# Patient Record
Sex: Female | Born: 1960 | Race: White | Hispanic: No | Marital: Single | Smoking: Former smoker
Health system: Southern US, Community
[De-identification: ages and names within clinical notes are randomized; demographics above are authoritative.]

## PROBLEM LIST (undated history)

## (undated) DIAGNOSIS — K589 Irritable bowel syndrome without diarrhea: Secondary | ICD-10-CM

## (undated) DIAGNOSIS — Q282 Arteriovenous malformation of cerebral vessels: Secondary | ICD-10-CM

## (undated) DIAGNOSIS — N029 Recurrent and persistent hematuria with unspecified morphologic changes: Secondary | ICD-10-CM

## (undated) DIAGNOSIS — K219 Gastro-esophageal reflux disease without esophagitis: Secondary | ICD-10-CM

## (undated) HISTORY — DX: Arteriovenous malformation of cerebral vessels: Q28.2

## (undated) HISTORY — DX: Irritable bowel syndrome, unspecified: K58.9

## (undated) HISTORY — DX: Gastro-esophageal reflux disease without esophagitis: K21.9

## (undated) HISTORY — DX: Recurrent and persistent hematuria with unspecified morphologic changes: N02.9

---

## 2002-02-17 ENCOUNTER — Encounter: Payer: Self-pay | Admitting: Neurosurgery

## 2002-02-17 ENCOUNTER — Ambulatory Visit (HOSPITAL_COMMUNITY): Admission: RE | Admit: 2002-02-17 | Discharge: 2002-02-17 | Payer: Self-pay | Admitting: Neurosurgery

## 2002-03-03 ENCOUNTER — Ambulatory Visit (HOSPITAL_COMMUNITY): Admission: RE | Admit: 2002-03-03 | Discharge: 2002-03-03 | Payer: Self-pay | Admitting: Neurosurgery

## 2002-03-08 ENCOUNTER — Encounter: Payer: Self-pay | Admitting: Neurosurgery

## 2002-03-08 ENCOUNTER — Inpatient Hospital Stay (HOSPITAL_COMMUNITY): Admission: RE | Admit: 2002-03-08 | Discharge: 2002-03-15 | Payer: Self-pay | Admitting: Neurosurgery

## 2002-03-09 ENCOUNTER — Encounter: Payer: Self-pay | Admitting: Neurosurgery

## 2002-03-10 ENCOUNTER — Encounter (INDEPENDENT_AMBULATORY_CARE_PROVIDER_SITE_OTHER): Payer: Self-pay | Admitting: Specialist

## 2002-03-10 HISTORY — PX: BRAIN SURGERY: SHX531

## 2002-03-12 ENCOUNTER — Encounter: Payer: Self-pay | Admitting: Neurosurgery

## 2002-04-01 ENCOUNTER — Encounter: Admission: RE | Admit: 2002-04-01 | Discharge: 2002-04-23 | Payer: Self-pay | Admitting: Neurosurgery

## 2002-08-27 ENCOUNTER — Encounter: Admission: RE | Admit: 2002-08-27 | Discharge: 2002-08-27 | Payer: Self-pay | Admitting: Family Medicine

## 2002-08-27 ENCOUNTER — Encounter: Payer: Self-pay | Admitting: Family Medicine

## 2002-09-09 ENCOUNTER — Ambulatory Visit (HOSPITAL_COMMUNITY): Admission: RE | Admit: 2002-09-09 | Discharge: 2002-09-09 | Payer: Self-pay | Admitting: Neurosurgery

## 2002-09-09 ENCOUNTER — Encounter: Payer: Self-pay | Admitting: Neurosurgery

## 2002-11-26 ENCOUNTER — Encounter: Admission: RE | Admit: 2002-11-26 | Discharge: 2002-11-26 | Payer: Self-pay | Admitting: Neurosurgery

## 2002-11-26 ENCOUNTER — Encounter: Payer: Self-pay | Admitting: Neurosurgery

## 2003-04-01 ENCOUNTER — Ambulatory Visit (HOSPITAL_COMMUNITY): Admission: RE | Admit: 2003-04-01 | Discharge: 2003-04-01 | Payer: Self-pay | Admitting: Interventional Radiology

## 2003-09-07 ENCOUNTER — Encounter: Admission: RE | Admit: 2003-09-07 | Discharge: 2003-09-07 | Payer: Self-pay | Admitting: Neurosurgery

## 2003-10-05 ENCOUNTER — Other Ambulatory Visit: Admission: RE | Admit: 2003-10-05 | Discharge: 2003-10-05 | Payer: Self-pay | Admitting: Family Medicine

## 2003-10-10 ENCOUNTER — Encounter: Admission: RE | Admit: 2003-10-10 | Discharge: 2003-10-10 | Payer: Self-pay | Admitting: Family Medicine

## 2004-04-21 IMAGING — XA IR ANGIO/CAROTID/CERV BI
1 series · 15 of 24 positions shown · IV contrast (omnipaque)
Comparison: none

FINDINGS
CLINICAL DATA: LEFT-SIDED NUMBNESS - INTERMITTENT, MRI REVEALING RIGHT CEREBRAL HEMISPHERE
ARTERIOVENOUS MALFORMATION.
CAROTID AND CEREBRAL ARTERIOGRAMS
FOLLOWING THE FULL EXPLANATION OF THE PROCEDURE ALONG WITH THE POTENTIALLY ASSOCIATED
COMPLICATIONS, AN INFORMED WITNESS CONSENT WAS OBTAINED.
THE RIGHT GROIN WAS PREPPED AND DRAPED IN THE USUAL STERILE FASHION.  THEREFORE, USING MODIFIED
SELDINGER TECHNIQUE, TRANSFEMORAL ACCESS INTO THE RIGHT COMMON FEMORAL ARTERY WAS OBTAINED WITHOUT
DIFFICULTY.  OVER A 0.035 INCH GUIDE WIRE, A 5-FRENCH PINNACLE SHEATH WAS INSERTED.  THROUGH THIS
AND ALSO OVER A 0.035 INCH GUIDE WIRE, A 5-FRENCH JB-1 CATHETER WAS ADVANCED TO THE AORTIC ARCH
REGION AND SELECTIVE CANNULATION ARTERIOGRAM WAS PERFORMED OF THE RIGHT COMMON CAROTID ARTERY, THE
RIGHT INTERNAL CAROTID ARTERY, THE RIGHT VERTEBRAL ARTERY, THE LEFT COMMON CAROTID ARTERY ARTERY,
AND THE LEFT VERTEBRAL ARTERY IN THAT ORDER.  THERE WERE NO ACUTE COMPLICATIONS.  THE PATIENT
TOLERATED THE PROCEDURE WELL.
MEDICATIONS UTILIZED:  VERSED 1 MG IV, FENTANYL 25 MG IV.
CONTRAST UTILIZED:  OMNIPAQUE 300, APPROXIMATELY 80 CC.

[Series 1: run · 15 of 154 slices shown]
[im 1/154]
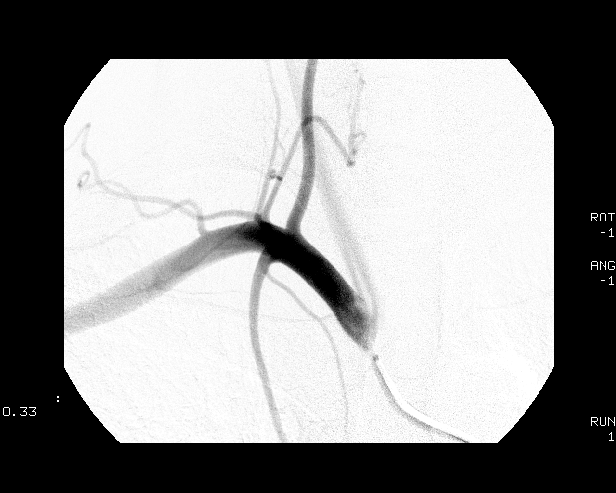
[im 14/154]
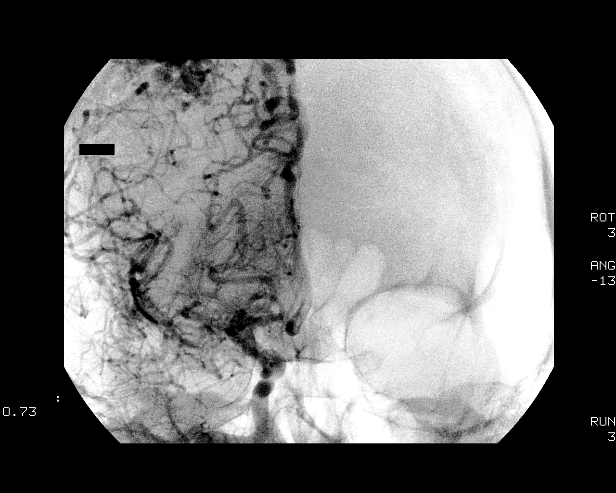
[im 27/154]
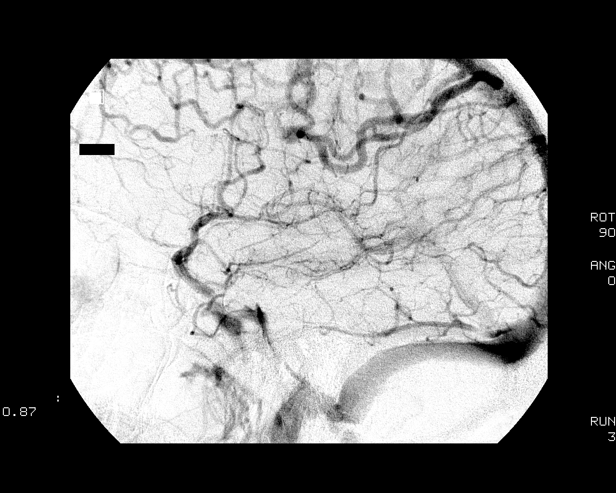
[im 34/154]
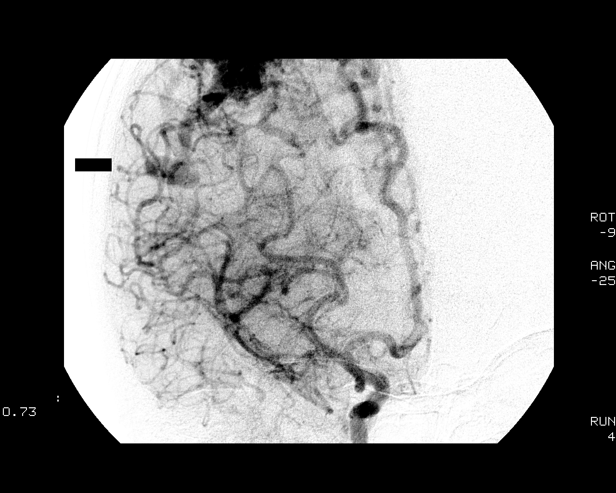
[im 47/154]
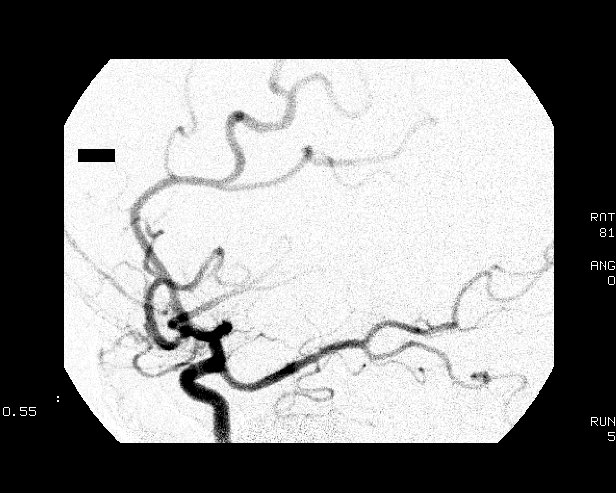
[im 54/154]
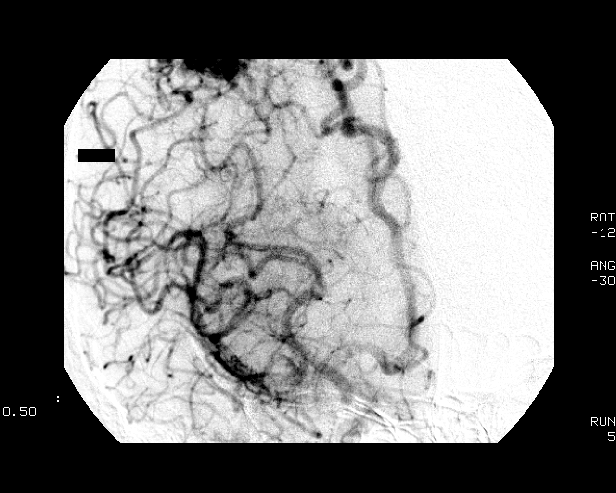
[im 67/154]
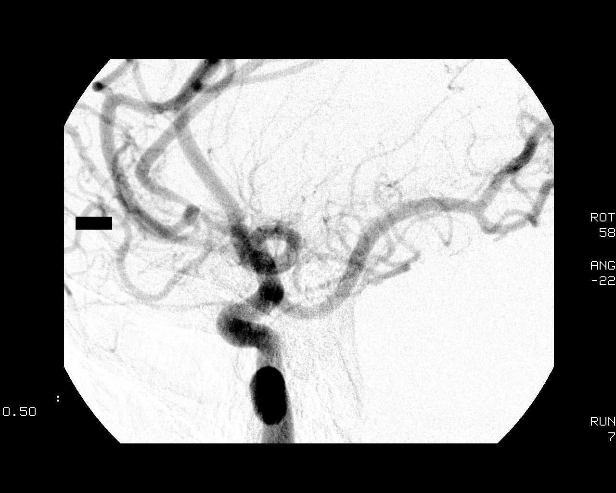
[im 80/154]
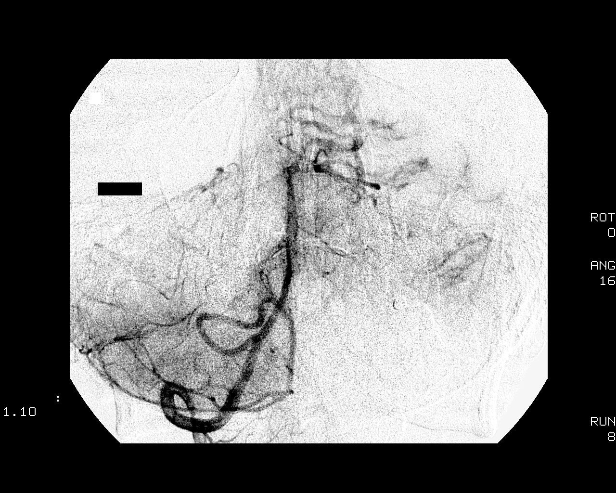
[im 87/154]
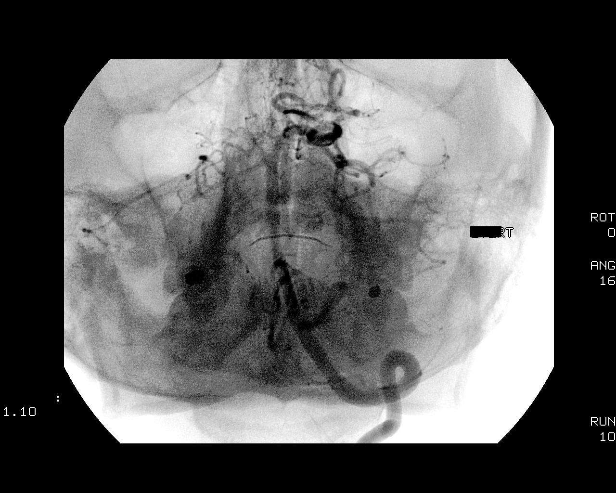
[im 100/154]
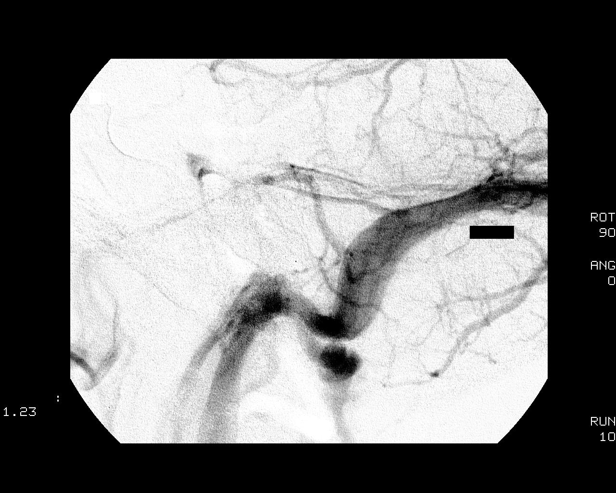
[im 107/154]
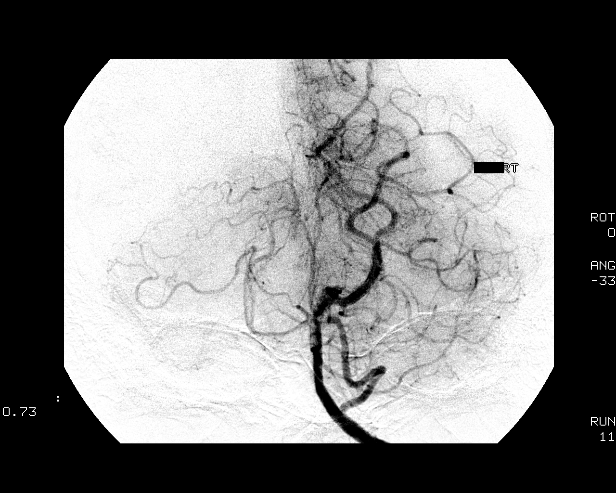
[im 120/154]
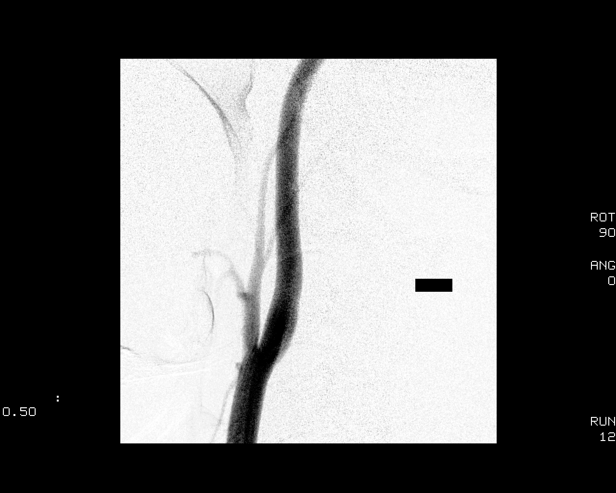
[im 134/154]
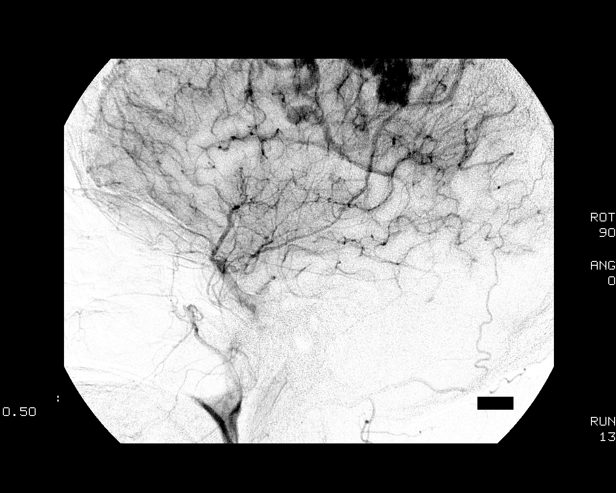
[im 140/154]
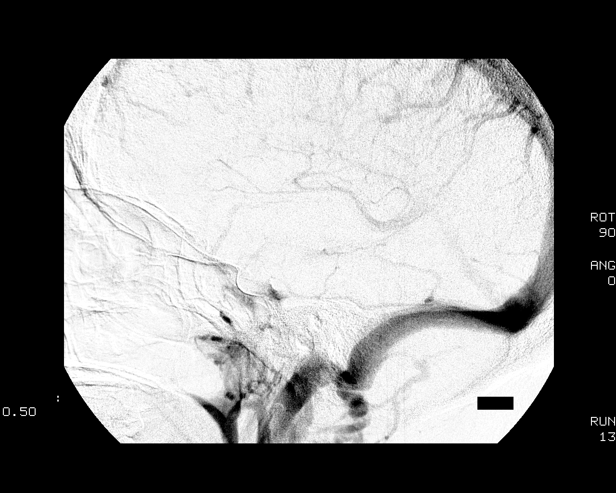
[im 154/154]
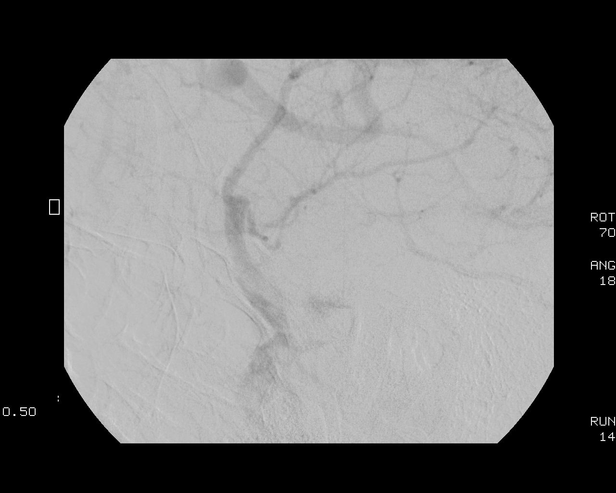

[15 of 24 positions shown; findings below may reference images not displayed]

FINDINGS: THE RIGHT COMMON CAROTID ARTERIOGRAM DEMONSTRATES THE RIGHT EXTERNAL CAROTID ARTERY
ORIGIN AND BRANCHES TO BE NORMAL.   THE RIGHT INTERNAL CAROTID ARTERY AT THE BULB AND DISTALLY IS
NORMAL.  THE PETROUS, CAVERNOUS, AND SUPRACLINOID ICA ALSO DEMONSTRATE NORMAL MORPHOLOGY AND
OPACIFICATION.   A DOMINANT RIGHT P-COM OPACIFIES THE RIGHT PCA DISTRIBUTION.
THE RIGHT ANTERIOR CEREBRAL ARTERY IS ABNORMALLY PROMINENT LEADING TO ITS PERICALLOSAL AND CALLOSAL
MARGINAL BRANCHES WHICH ARE ALSO ABNORMALLY PROMINENT.  THESE LEAD INTO A 2 CM NIDUS OVERLYING THE
RIGHT POSTERIOR FRONTAL CONVEXITY WITH SUBSEQUENT SINGLE VENOUS CONDUIT WHICH THEN SPLITS INTO TWO
VENOUS CORTICAL BRANCHES DRAINING INTO THE SUPERIOR SAGITTAL SINUS.    NO EVIDENCE OF INTERNIDAL OR
PRENIDAL ANEURYSM IS DEMONSTRATED ANGIOGRAPHICALLY.
ALSO NOTED IS SEVERE FOCAL STENOSIS INVOLVING THE RIGHT MIDDLE CEREBRAL ARTERY IN ITS M1 SEGMENT
ASSOCIATED WITH POSSIBLE EARLY PROMINENT LENTICULOSTRIATE ARTERIES.
THERE IS DELAYED OPACIFICATION PROXIMALLY IN THE ANGULAR BRANCH OF THE RIGHT MIDDLE CEREBRAL ARTERY
WITH SUBSEQUENT RETROGRADE OPACIFICATION FROM THE DISTAL PERICALLOSAL BRANCHES, AND THE RIGHT P3
BRANCHES.    A SELECTIVE RIGHT INTERNAL CAROTID ARTERY INJECTION DEMONSTRATES THE ABOVE FINDINGS
MORE CLEARLY INVOLVING THE RIGHT MIDDLE CEREBRAL ARTERY, AND ALSO THE ARTERIAL FEEDERS TO THE RIGHT
FRONTAL LOBE AVM AS DESCRIBED.
THE RIGHT VERTEBRAL ARTERY IS A NONDOMINANT VERTEBRAL ARTERY WITH NORMAL ORIGIN.  THE VESSEL
ASCENDS NORMALLY THROUGH THE CRANIAL SKULL BASE.  THERE IS NORMAL OPACIFICATION OF THE RIGHT
POSTERIOR INFERIOR CEREBELLAR ARTERY.  TRANSIENT OPACIFICATION OF THE BASILAR ARTERY AND THE
SUPERIOR CEREBELLAR ARTERY IS NOTED VIA THIS INJECTION.
THE LEFT COMMON CAROTID ARTERIOGRAM DEMONSTRATES THE LEFT EXTERNAL CAROTID ARTERY ORIGIN AND
BRANCHES TO BE NORMAL.  THE LEFT INTERNAL CAROTID ARTERY AT THE BULB AND DISTALLY IS NORMAL.  THE
PETROUS, CAVERNOUS, AND THE SUPRACLINOID SEGMENTS ARE NORMAL.   THE LEFT ANTERIOR AND THE LEFT
MIDDLE CEREBRAL ARTERIES OPACIFY NORMALLY INTO CAPILLARY AND VENOUS PHASES.
THERE WAS CROSS OPACIFICATION VIA THE A-COM OF THE RIGHT ACA DISTRIBUTION DISTAL TO THE A2 WITH
SUBSEQUENT OPACIFICATION OF THE RIGHT FRONTAL AVM AS DESCRIBED PREVIOUSLY.   IN THE A-COM REGION
THERE IS AN APPROXIMATELY 1 MM SACCULAR OUTPOUCHING PROJECTING ANTERIORLY MOST CONSISTENT WITH A
SMALL ANEURYSM IN THIS REGION.
THE LEFT VERTEBRAL ARTERY ORIGIN IS NORMAL.  THIS VESSEL IS THE DOMINANT VERTEBRAL ARTERY WHICH
ASCENDS NORMALLY THROUGH THE CRANIAL SKULL BASE.  THE RIGHT POSTERIOR INFERIOR CEREBRAL ARTERY, THE
BASILAR ARTERY, THE POSTERIOR CEREBRAL ARTERY, THE SUPERIOR CEREBELLAR ARTERIES AND THE ANTERIOR
INFERIOR CEREBELLAR ARTERIES OPACIFY NORMALLY INTO CAPILLARY AND VENOUS PHASES.
IMPRESSION
1.  RIGHT POSTEROFRONTAL ARTERIOVENOUS MALFORMATION PRIMARILY CORTICAL BASED SUPPLIED BY TWO
ARTERIAL FEEDERS FROM THE PERICALLOSAL AND AT LEAST TWO ARTERIAL FEEDERS FROM THE CALLOSAL MARGINAL
ARTERY.  THE AVM HAS A NIDUS OF APPROXIMATELY 2 CM.
2.  TWO VENOUS DRAINING VEINS INTO THE SUPERIOR SAGITTAL SINUS.
3.  SIGNIFICANT RIGHT MIDDLE CEREBRAL ARTERY STENOSIS.  THIS MAY REPRESENT A SEQUELAE OF DISSECTION
VERSUS LESS LIKELY POSSIBILITY OF FOCAL ATHEROSCLEROTIC DISEASE.
4.  APPROXIMATELY 1 MM SACCULAR OUTPOUCHING IN THE A-COM REGION PROBABLY REPRESENTING A SMALL
ANEURYSM, POSSIBLY RELATED TO HEMODYNAMIC STRESS DUE TO THE AVM.

## 2004-09-05 ENCOUNTER — Encounter: Admission: RE | Admit: 2004-09-05 | Discharge: 2004-09-05 | Payer: Self-pay | Admitting: Neurosurgery

## 2004-10-12 ENCOUNTER — Ambulatory Visit (HOSPITAL_COMMUNITY): Admission: RE | Admit: 2004-10-12 | Discharge: 2004-10-12 | Payer: Self-pay | Admitting: Neurosurgery

## 2005-02-21 ENCOUNTER — Other Ambulatory Visit: Admission: RE | Admit: 2005-02-21 | Discharge: 2005-02-21 | Payer: Self-pay | Admitting: Family Medicine

## 2005-06-10 HISTORY — PX: SPINE SURGERY: SHX786

## 2005-06-27 ENCOUNTER — Encounter: Admission: RE | Admit: 2005-06-27 | Discharge: 2005-06-27 | Payer: Self-pay | Admitting: Family Medicine

## 2005-12-17 ENCOUNTER — Ambulatory Visit (HOSPITAL_BASED_OUTPATIENT_CLINIC_OR_DEPARTMENT_OTHER): Admission: RE | Admit: 2005-12-17 | Discharge: 2005-12-17 | Payer: Self-pay | Admitting: Urology

## 2006-01-17 ENCOUNTER — Ambulatory Visit: Payer: Self-pay | Admitting: Family Medicine

## 2006-02-14 ENCOUNTER — Encounter: Admission: RE | Admit: 2006-02-14 | Discharge: 2006-02-14 | Payer: Self-pay | Admitting: Neurosurgery

## 2006-03-11 ENCOUNTER — Ambulatory Visit: Payer: Self-pay | Admitting: Family Medicine

## 2006-03-14 ENCOUNTER — Encounter: Admission: RE | Admit: 2006-03-14 | Discharge: 2006-03-14 | Payer: Self-pay | Admitting: Neurosurgery

## 2006-04-02 ENCOUNTER — Ambulatory Visit (HOSPITAL_COMMUNITY): Admission: RE | Admit: 2006-04-02 | Discharge: 2006-04-03 | Payer: Self-pay | Admitting: Neurosurgery

## 2006-04-14 ENCOUNTER — Ambulatory Visit: Payer: Self-pay | Admitting: Family Medicine

## 2006-05-15 ENCOUNTER — Encounter: Admission: RE | Admit: 2006-05-15 | Discharge: 2006-05-15 | Payer: Self-pay | Admitting: Neurosurgery

## 2006-07-04 ENCOUNTER — Encounter: Admission: RE | Admit: 2006-07-04 | Discharge: 2006-07-04 | Payer: Self-pay | Admitting: Neurosurgery

## 2006-07-04 ENCOUNTER — Ambulatory Visit: Payer: Self-pay | Admitting: Family Medicine

## 2006-09-26 ENCOUNTER — Ambulatory Visit: Payer: Self-pay | Admitting: Family Medicine

## 2006-10-06 ENCOUNTER — Ambulatory Visit: Payer: Self-pay | Admitting: Family Medicine

## 2006-12-15 ENCOUNTER — Ambulatory Visit: Payer: Self-pay | Admitting: Family Medicine

## 2007-02-26 ENCOUNTER — Ambulatory Visit: Payer: Self-pay | Admitting: Family Medicine

## 2007-02-26 ENCOUNTER — Other Ambulatory Visit: Admission: RE | Admit: 2007-02-26 | Discharge: 2007-02-26 | Payer: Self-pay | Admitting: Family Medicine

## 2007-03-05 ENCOUNTER — Ambulatory Visit: Payer: Self-pay | Admitting: Family Medicine

## 2007-03-19 LAB — HM DEXA SCAN: HM Dexa Scan: NORMAL

## 2007-03-26 ENCOUNTER — Ambulatory Visit: Payer: Self-pay | Admitting: Family Medicine

## 2007-04-17 ENCOUNTER — Ambulatory Visit: Payer: Self-pay | Admitting: Cardiology

## 2007-04-20 LAB — HM COLONOSCOPY

## 2007-05-01 ENCOUNTER — Encounter: Payer: Self-pay | Admitting: Cardiology

## 2007-05-01 ENCOUNTER — Ambulatory Visit: Payer: Self-pay

## 2007-05-01 ENCOUNTER — Ambulatory Visit: Payer: Self-pay | Admitting: Cardiology

## 2007-05-22 ENCOUNTER — Ambulatory Visit: Payer: Self-pay | Admitting: Family Medicine

## 2007-08-14 ENCOUNTER — Ambulatory Visit: Payer: Self-pay | Admitting: Family Medicine

## 2007-09-22 ENCOUNTER — Ambulatory Visit: Payer: Self-pay | Admitting: Cardiology

## 2007-11-05 ENCOUNTER — Ambulatory Visit: Payer: Self-pay | Admitting: Family Medicine

## 2008-01-28 ENCOUNTER — Ambulatory Visit: Payer: Self-pay | Admitting: Family Medicine

## 2008-04-22 ENCOUNTER — Ambulatory Visit: Payer: Self-pay | Admitting: Family Medicine

## 2008-07-18 ENCOUNTER — Ambulatory Visit: Payer: Self-pay | Admitting: Family Medicine

## 2008-10-24 ENCOUNTER — Ambulatory Visit: Payer: Self-pay | Admitting: Family Medicine

## 2009-08-23 ENCOUNTER — Ambulatory Visit: Payer: Self-pay | Admitting: Physician Assistant

## 2009-08-23 ENCOUNTER — Other Ambulatory Visit: Admission: RE | Admit: 2009-08-23 | Discharge: 2009-08-23 | Payer: Self-pay | Admitting: Family Medicine

## 2010-10-02 ENCOUNTER — Ambulatory Visit (INDEPENDENT_AMBULATORY_CARE_PROVIDER_SITE_OTHER): Payer: Managed Care, Other (non HMO) | Admitting: Family Medicine

## 2010-10-02 DIAGNOSIS — R51 Headache: Secondary | ICD-10-CM

## 2010-10-23 NOTE — Assessment & Plan Note (Signed)
[facility]                            CARDIOLOGY OFFICE NOTE   LADAWN, BOULLION                      MRN:          [id number]  DATE:09/22/2007                            DOB:          [birth year]    REASON FOR VISIT:  Follow-up testing.   HISTORY OF PRESENT ILLNESS:  I saw Ms. Yablonski back in November.  She  was referred at that time with occasional palpitations and intermittent  left arm and chest discomfort.  I referred her for an exercise  echocardiogram which showed no electrocardiographic or echocardiographic  evidence of ischemia and a good workload.  Her baseline images  demonstrated no significant valvular heart disease and a normal ejection  fraction of 65%.  She wore a cardiac monitor and we received a hookup  tracing, but she had no other major symptoms while wearing the device.  Her electrocardiogram in follow-up today shows sinus rhythm at 60 beats  per minute.  Symptomatically, she has not had any new problems and  actually reports feeling fairly well.  We talked about her studies and  suggested that she continue with a strategy of observation at this  point.  Clearly, if her symptoms progress or worsen, we can consider  additional evaluation.   ALLERGIES:  LATEX.   MEDICATIONS:  1. Depo-Provera.  2. Advil p.r.n.  3. Aspirin 81 mg daily.   REVIEW OF SYSTEMS:  As described in the history of present illness,  otherwise, negative.   PHYSICAL EXAMINATION:  VITAL SIGNS:  Blood pressure is 122/78, heart  rate is 60, weight 152 pounds.  GENERAL:  The patient is comfortable and in no acute distress.  NECK:  Shows no elevated jugular venous pressure.  No loud bruits.  LUNGS:  Clear, unlabored breathing.  CARDIAC:  Regular rate and rhythm.  No murmur, rub or gallop.  EXTREMITIES:  No pitting edema.   IMPRESSION/RECOMMENDATION:  Reassuring cardiac evaluation.  The  patient's exercise echocardiogram was normal and we defined no  specific  dysrhythmias with event monitoring.  The patient is symptomatically  stable at this point, and I have recommended observation.  If she  has any progressive symptomatology, we could certainly evaluate her  further.  She will continue to follow up with Dr. Susann Givens.     Jonelle Sidle, MD  Electronically Signed    SGM/MedQ  DD: 09/22/2007  DT: 09/22/2007  Job #: [id number]   cc:   Sharlot Gowda, M.D.

## 2010-10-23 NOTE — Assessment & Plan Note (Signed)
[facility]                            CARDIOLOGY OFFICE NOTE   TA, FAIR                      MRN:          [id number]  DATE:04/17/2007                            DOB:          Mar 10, 1961    REFERRING PHYSICIAN:  Sharlot Gowda, M.D.   REASON FOR CONSULTATION:  History of palpitations and left arm  discomfort.   HISTORY OF PRESENT ILLNESS:  The patient is a 50 year old woman with a  history of irritable bowel syndrome as well as previous brain  arteriovenous malformation status post embolization in October of 2003.  She also had a cervical fusion with findings of cervical disk disease  that may have actually been related to some of her left arm discomfort  as she states that she has had fewer of these symptoms since that time.  She also reports a fairly chronic numbness and mild degree of weakness  in her left arm following her prior brain surgery.  She does state that  she has had some recurrence of posterior left upper arm discomfort,  although, this is very sporadic and not clearly exertional in nature.  She does experience some dyspnea on exertion, but does not describe this  as being any greater than NYHA class II.  She has no exertional chest  pain.  She has noticed over the last few years as well a sensation of  palpitations described as a flutter in her chest as well as  occasionally a skip.  The fluttering sensation is typically sudden in  onset and lasts up to a few minutes at a time.  She may get these once  or twice a month and never in association with any syncope, left arm or  chest discomfort.  Her resting electrocardiogram shows normal sinus  rhythm with normal intervals and heart rate is 74 beats per minute.  She  reports having an echocardiogram done a little greater than a year ago  which may have shown some evidence of mitral valve prolapse based on her  description, although, I do not have the formal report at hand.   We have  been asked to assist with further risk stratification and assessment.   ALLERGIES:  LATEX.   CURRENT MEDICATIONS:  1. Depo-Provera.  2. Protonix twice weekly.  3. Aspirin 81 mg a day.  4. Advil.  5. Scolaxin p.r.n.  6. Amitiza p.r.n.  7. Tylenol Sinus p.r.n.   PAST MEDICAL HISTORY:  As outlined above.   SOCIAL HISTORY:  The patient is single.  She has no children.  She is a  Visual merchandiser.  She has a prior longstanding tobacco use history for  23 years, but quit in November of 2005.  She drinks 4-5 beers on a daily  basis, 4 cups of caffeinated beverage a day.  She enjoys golfing and  walks 1-2 miles a day at her job.   FAMILY HISTORY:  Significant for hypertension in the patient's father  who is still living.  Her maternal grandmother had heart disease.   REVIEW OF SYSTEMS:  Described in the history of present illness.  Seasonal allergies are noted.  Some constipation, reflux symptoms, and  menstrual dysfunction.   PHYSICAL EXAMINATION:  VITAL SIGNS:  Blood pressure 136/77, heart rate  82, weight 148 pounds.  GENERAL:  The patient is comfortable and in no acute distress.  HEENT:  Conjunctivae and lids normal.  Oropharynx is clear.  NECK:  Supple.  No elevated jugular venous pressure, no loud bruits.  No  thyromegaly is noted.  LUNGS:  Clear without labored breathing at rest.  HEART:  Regular rate and rhythm.  No obvious mid systolic click.  Soft,  systolic murmur is noted.  No pericardial rub or S3 gallop.  ABDOMEN:  Nontender.  Normal active bowel sounds.  Soft.  EXTREMITIES:  No pitting edema.  Skin is warm and dry.  Distal pulses  are 2+.  MUSCULOSKELETAL:  No kyphosis is noted.  NEUROPSYCHIATRIC:  The patient is alert and oriented x3.  Affect is  normal.   IMPRESSION:  1. History of intermittent left arm discomfort.  This really seems to      be more chronic and recurring and perhaps related to her cervical      disk disease and prior brain  arteriovenous malformation surgery.      The symptoms are nonexertional and do not appear to be associated      with any shortness of breath or other functional limitation.  It      seems unlikely that this is related to underlying ischemic heart      disease.  She does have intermittent palpitations which could be      episodic paroxysmal supraventricular tachycardia.  Her only      exertional symptom at this time seems to be dyspnea when she is      playing golf, although, she does not describe this as being greater      than NYHA class II.  Her resting electrocardiogram is normal.  She      has undergone no prior cardiac risk stratification and our plan at      this point will be an exercise echocardiogram to exclude any      exercise-induced dysrhythmia and also rule out ischemic which I      think is unlikely at this point.  In addition to this, I am      providing a 30-day event recorder to see if we can capture one of      her episodes of rapid palpitations.  I will have her follow up with      me in the office over the next month and we can review the results      of her studies.  2. Further plans to follow.     Jonelle Sidle, MD  Electronically Signed    SGM/MedQ  DD: 04/17/2007  DT: 04/18/2007  Job #: [id number]   cc:   Elpidio Galea, P.A.C.

## 2010-10-26 NOTE — Op Note (Signed)
NAMERONIQUE, SIMERLY               ACCOUNT NO.:  [id number]   MEDICAL RECORD NO.:  [id number]          PATIENT TYPE:  AMB   LOCATION:  [facility]                         FACILITY:  [facility]   PHYSICIAN:  Excell Seltzer. Annabell Howells, M.D.    DATE OF BIRTH:  [birth year]   DATE OF PROCEDURE:  12/17/2005  DATE OF DISCHARGE:                                 OPERATIVE REPORT   PROCEDURE:  Cystoscopy, bilateral retrograde pyelogram with interpretation  and right ureteroscopy.   PREOPERATIVE DIAGNOSIS:  Right distal ureteral stone.   POSTOPERATIVE DIAGNOSIS:  History of right ureteral stone with stone not  found at procedure.   SURGEON:  Dr. Bjorn Pippin.   ANESTHESIA:  General.   COMPLICATIONS:  None.   INDICATIONS:  Ms. Toniann Ket is a 50 year old white female with history of right  distal ureteral stone on CT scan.  She has had intermittent pain since  December 6.  She has had a follow-up KUB's that have revealed persistent  presence of calcification in the right pelvis that was felt to be her stone.  Urine has had persistent microhematuria.  It was felt that cystoscopy and  possible ureteroscopy and retrograde pyelograms were indicated for further  evaluation.  She was having some left sided pain and wanted me to check the  left side as well.  I agreed to do that with the retrograde.   FINDINGS AND PROCEDURE:  The patient is given Cipro.  She was taken the  operating room where latex precautions were in place.  A general anesthetic  was induced.  She was placed in lithotomy position.  Her perineum and  genitalia were prepped with Betadine solution.  She was draped in the usual  sterile fashion.  Cystoscopy was performed using a 22-French scope and 12  and 70 degrees lenses.  Examination revealed normal urethra.  The bladder  wall had mild trabeculation without tumor, stones or inflammation.  Ureteral  orifices were unremarkable.   The left ureteral orifice was cannulated with a 5-French open end catheter  and contrast was instilled.   Left retrograde pyelogram demonstrated a normal ureter and intrarenal  collecting system without filling defects or other abnormalities.   The right ureteral orifice was then cannulated with a 5-French catheter and  contrast was instilled.   The right retrograde pyelogram demonstrated no obvious filling defects,  hydronephrosis or other abnormalities.  Initially I did not see the  calcification that had been present on the preop KUB; however, later in the  procedure it was identified medial to the ureter as noted on the retrograde  pyelogram, it was somewhat obscured by some of the table hardware.   Because the location of the calcification in relation to the ureter was not  readily apparent, I felt the right ureteroscopy was indicated.  The right  ureteral orifice was cannulated with a 6-French short ureteroscope.  This  was advanced approximately 4 cm up the ureter to the level of the  calcifications seen on KUB.  At this point I could identify the  calcification on the fluoroscopy and it was medial to the  tip of the  ureteroscope no stone was seen within the ureter.   At this point, additional contrast was instilled and then the calcification  was confirmed to be medial to the ureter suggesting that it was phlebolith  and not actually a ureteral stone.   At this point the bladder was drained.  The patient was taken down from  lithotomy position.  Her anesthetic was reversed.  She was removed to  recovery room in stable condition.  There were no complications.      Excell Seltzer. Annabell Howells, M.D.  Electronically Signed     JJW/MEDQ  D:  12/17/2005  T:  12/17/2005  Job:  [id number]

## 2010-10-26 NOTE — Op Note (Signed)
NAMESARAH, [name]                           ACCOUNT NO.:  [id number]   MEDICAL RECORD NO.:  [id number]                   PATIENT TYPE:  OIB   LOCATION:  3108                                 FACILITY:  [facility]   PHYSICIAN:  [name], M.D.               DATE OF BIRTH:  [birth year]   DATE OF PROCEDURE:  03/10/2002  DATE OF DISCHARGE:                                 OPERATIVE REPORT   PREOPERATIVE DIAGNOSIS:  Right posterior frontal arteriovenous malformation.   POSTOPERATIVE DIAGNOSIS:  Right posterior frontal arteriovenous  malformation.   PROCEDURE:  Right parietal craniotomy for arteriovenous malformation  resection with microdissection and frameless stereotactic guidance.   COMPLICATIONS:  None.   SURGEON:  [name], M.D.   ASSISTANT:  [name]. [name], M.D.   FINDINGS:  Arteriovenous malformation in the right posterior frontal cortex  beneath very large, superficial draining veins.   INDICATIONS:  The patient is a 50 year old who was incidentally found to  have an arteriovenous malformation.  She felt that she wanted to have this  out and did not want to deal with stereotactic radiosurgery.  She was  therefore admitted for preoperative embolization and then taken today for  craniotomy.   OPERATIVE NOTE:  The patient was brought to the operating room intubated and  placed under general anesthesia without difficulty.  After adequate  anesthesia was obtained, she was then positioned with a three pin [place]  and secured to the bed.  Stereotactic checkpoints were then used and the  stealth was set up.  The patient was then draped and she was prepped in a  sterile fashion.  I infiltrated 10 cc of 0.5% lidocaine, 1:200,000 strength  epinephrine into the horseshoe flap scalp for my incision.  I made the  incision with a #10 blade and took this down to the bone.  I reflected the  flap inferiorly and it was based inferiorly.  Raney clips were placed for  hemostasis.  I then made four bur holes at each corner of the scalp opening.  A craniotome was then used and I performed a craniotomy.  I tacked up the  dura around the edges except for the midline edge where I was going to base  a dural opening.  I then opened the dura inferiorly and cam around in a U-  shaped fashion basing it on this superior sagittal sinus.  Some arachnoid  granulations were found in the anteromedial corner of the opening.  I also  took down some minor adhesions overlying the arteriovenous malformation with  bipolar cautery.  I then brought the microscope in for microdissection.   I then started what was a very meticulously and painstaking dissection  around the arteriovenous malformation.  The drainage was superficial and on  the cortex and the bulk of the arteriovenous malformation was inferior to it  which necessitated a circumferential dissection around the borders  so that I  could then get underneath it and around it.  This was done over the course  of hours without any complication.  Dr. [name] assisted with the  microdissection and AVM resection.  After I made my way entirely around the  arteriovenous malformation, what was the major draining vessel was then  clamped with bipolar clips.  There was swelling of the arteriovenous  malformation.  The base was then coagulated and I cut through that with  microscissors.  Once that was done, the arteriovenous malformation was  delivered from the wound without difficulty.  Dr. [name] and I inspected the  wound bed.  There was one vessel which was slightly abnormal in appearance  but I could not see how it was connected in any way to the AVM.  There had  been essentially a bipolar-free dissection on the AVM and I certainly did  not think that I had been dissecting through the AVM as it was quite  globular in character.  There was little intervening brain between the  arteriovenous malformation.  I therefore did not feel it  was worthwhile to  dissect further and this vessel was left.  I planned on getting a  postoperative angiogram anyway.   Throughout the course of the dissection, I used Gelfoam and paddings to  protect the brain edges.  This was all done with the microscope and the  final resection cavity was hemostatic.  I wanted to use Surgicel.  I then  irrigated once more.  I then reapproximated dura.  I placed a central tack-  up and a tack-up along the sinus.  Jelly was lateral to the sinuses.  There  was one tack-up in the midline.  I then reapproximated the scalp flap.  I  then closed the scalp flap with staples.  Sterile dressing was applied.  The  patient was extubated, moving all extremities.                                               [name], M.D.    Luna Kitchens  D:  03/10/2002  T:  03/11/2002  Job:  [id number]

## 2010-10-26 NOTE — H&P (Signed)
NAMEJACALYN, Shannon [name]                           ACCOUNT NO.:  [id number]   MEDICAL RECORD NO.:  [id number]                   PATIENT TYPE:  OIB   LOCATION:  3172                                 FACILITY:  [facility]   PHYSICIAN:  [name], M.D.               DATE OF BIRTH:  August 06, 1960   DATE OF ADMISSION:  03/08/2002  DATE OF DISCHARGE:                                HISTORY & PHYSICAL   ADMISSION DIAGNOSIS:  Right frontal arteriovenous malformation.   INDICATIONS::  The patient presented to my office on 02/04/02 for evaluation  of  a right frontal arteriovenous malformation.  She had a history of  numbness and of pins and needles dysesthesia in the upper extremities  bilaterally in the hands and forearms.  This started after a visit to the  dentist where she received a local anesthetic.  She has also noticed that  her right leg and anterior thigh have had some tingling with a numb spot  below the knee. This led to a brain MRI which revealed an arteriovenous  malformation.  She also had an EMG which did not show evidence of carpal  tunnel disease.  She stated that she had had symptoms like that in the past.  They are bilateral.  She thinks the left-sided dysesthesias may be slightly  more prominent than on the right.   PAST MEDICAL HISTORY:  Excellent.  She has had irregular menses.  She takes  no medications.   REVIEW OF SYSTEMS:  Positive for contact lenses, leg pain with walking,  change in her bowel habits, back pain, stiff neck, difficulty with speech  and blurred vision.  She denies constitutional ear, nose, throat, mouth,  respiratory, gastrointestinal, genitourinary, skin, psychiatric, endocrine,  hematologic and allergic problems.  She has had back pain which comes and  goes.  She broke her tailbone 18 years ago.  She has gone from loose bowel  to constipation to loose bowels once again.  Neck just feels stiff.  She  feels at times that she has problems forming and  finding words and visual  field changes and depth perception she thinks is poor.  She does have a  history of headaches.   FAMILY HISTORY:  Positive for epilepsy in her brother.  Mother is 80 and is  good health and has thyroid disease.  Father 37, is in good health and has  hypertension.  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  She  smokes 3/4 pack of cigarettes a day since age 38.  She does not use illicit  drugs.   PAST SURGICAL HISTORY:  Includes oral surgery in 1979.  She has no known  drug allergies.  She is 5 feet 8 inches tall and weighs 126 pounds.   PHYSICAL EXAMINATION:  GENERAL:  She is alert and oriented x 4 and answers  all questions appropriately.  Memory range, attention  span and fund of  knowledge are normal.  She is well kempt and in no distress.  HEENT:  Pupils are equal, round and reactive to light.  She has full  extraocular movements and normal funduscopic exam and normal visual fields.  She has symmetric facial sensation and symmetric facial movements.  Hearing  is intact bilaterally.  Uvula elevated in the midline.  Shoulder shrug is  normal.  Tongue protrudes in the midline.  Strength is 5/5 in the upper and  lower extremities.  There is no drift on examination.  She has normal muscle  tone, bulk and coordination.  Romberg test is negative.  She has a normal  gait.  She tandem walks easily.  There are no cervical masses or bruits.  LUNGS:  Clear.  HEART:  Regular rhythm and rate.  No murmurs or rubs.  Proprioception is  normal.  Pinprick is intact and normal. She has no Hoffman sign or no  clonus.  Toes are downgoing with plantar stimulation.   MRI of the brain shows approximately  2 x 2.25 cm area of flow void which is  superficial and at the cortical surface.  It is consistent with  arteriovenous malformation.  The patient also underwent a four vessel  cerebral arteriogram which shows arteriovenous malformation in right  posterior frontal  region.  There appear to be four major arterial feeders  from the callosal marginal and the pericallosal vessels and two major venous  outflows.  The venous drainage was superficial.  There were no intranidal  aneurysms.  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   The patient has an arteriovenous malformation which has not bled nor does it  appear to be symptomatic. It was not at all clear that the dysesthesias she  feels would be referable to this lesion.  I explained to the patient that an  AVM is a congenital lesion. She over a lifetime has a fairly high risk of  rupture.  Hemorrhages due to AVMs are typically not fatal.  More often than  not they are found as the result of seizures or diagnosed at the time of  hemorrhage.  I believe that given her age, the size of the lesion,  accessibility of AVM that treatment is her best option.  She decided to  forego a radiosurgical approach and is opting said for surgery.  She will be  admitted today to undergo preoperative embolization.  She is to be taken to  the operating room if all goes as planned on Wednesday, 10/1 for a right  frontal parietal craniotomy and AVM resection.  Risks of the procedure  including bleeding, infection, stroke, coma, death, paralysis, inability to  resect the AVM, intraoperative rupture, seizures were discussed.  She  understands and wishes to proceed.                                               [name], M.D.    Shannon [name]  D:  03/08/2002  T:  03/10/2002  Job:  [id number]

## 2010-10-26 NOTE — Op Note (Signed)
NAMEMEDORA, ROORDA               ACCOUNT NO.:  [id number]   MEDICAL RECORD NO.:  [id number]          PATIENT TYPE:  OIB   LOCATION:  3018                         FACILITY:  [facility]   PHYSICIAN:  Coletta Memos, M.D.     DATE OF BIRTH:  1961/02/24   DATE OF PROCEDURE:  04/02/2006  DATE OF DISCHARGE:  04/03/2006                                 OPERATIVE REPORT   PREOPERATIVE DIAGNOSIS:  1. Cervical spondylosis C5-C6 and C6-C7 without myelopathy.  2. Cervical radiculopathy.  3. Cervical degenerative disc disease without myelopathy.   POSTOPERATIVE DIAGNOSIS:  1. Cervical spondylosis C5-C6 and C6-C7 without myelopathy.  2. Cervical radiculopathy.  3. Cervical degenerative disc disease without myelopathy.   PROCEDURE:  1. Anterior cervical decompression C5-C and C6-C7.  2. Arthrodesis C5 to C7 with two 7-mm interbody bone plugs filled with DBX      morselized allograft.  3. Anterior instrumentation 34 mm Vectra plate, 60-AV screws, two in C5,      two in C6, two in C7.   COMPLICATIONS:  None.   SURGEON:  Coletta Memos, M.D.   ASSISTANT:  Hewitt Shorts, M.D.   INDICATIONS:  Marilene Vath is a patient well known to me who presented with  severe pain in the left upper extremity.  Cervical spine films and MRI  showed rather severe degenerative disease at C5-C6 and C6-C7 along with  canal stenosis and foraminal stenosis.  I, therefore, recommended and she  agreed to undergo operative decompression.   OPERATIVE NOTE:  Ms. Hauge was brought to the operating room, intubated,  and placed under general anesthetic without difficulty.  Her head was  positioned on a horseshoe in slight extension.  Her neck was prepped, she  was draped in a sterile fashion.  I infiltrated 6 mL 0.5% lidocaine with  1:200,000 strength epinephrine into my proposed incision starting from the  midline extending to the medial border of the left sternocleidomastoid  muscle approximately 3 fingerbreadths above  the sternal notch.  I opened the  skin with a #10 blade and took this down to the platysma.  I dissected in a  plane above the platysma rostrally and caudally.  I then opened the platysma  in a horizontal fashion using Metzenbaum scissors.  I then dissected  inferior to the platysma rostrally and caudally.  I then dissected through  an avascular plane medial to the sternocleidomastoid and lateral to the  strap muscles until I exposed the cervical spine anteriorly.  I placed a  spinal needle and took an x-ray and the needle was at C6-C7.  I did  appreciate what was a large osteophyte at that level.   I then proceeded with my decompression.  I opened the disc spaces at both  levels using a 15 blade.  I then placed distraction pins, one at C5 and the  other at C6.  I distracted the disc space.  I then proceeded with a  discectomy.  I used a Kerrison punch to remove the anterior lip of bone at  the C5-C6 space.  I then used a high speed  drill, Kerrison punches, and  curets along with Leksell rongeur to remove disc material.  I was able to  open the posterior longitudinal ligament and then fully decompress the  spinal canal at the C5-C6 level.  I also fully decompressed both C6 nerve  roots using Kerrison punches and a high speed drill.  I removed what were  large osteophytes.   After the decompression was done, I then turned to the arthrodesis.  I  prepared the endplates using a high-speed drill.  I then sized a 7 mm bone  graft and filled that with DBX putty.  I then placed a 7 mm bone graft into  the now empty disc space at C5-C6.  I then turned my attention to the C6-C7  disc space.  I removed the distraction pin at C5 and placed it at C7.  I  then distracted that disc space and again, with Kerrison punches, removed  the anterior lip.  I used a high speed drill and Kerrison punches to remove  osteophytes drilling down until  I could remove those and decompress the  spinal canal using  Kerrison punches.  I decompressed both C7 nerve roots and  the canal.  Once that was done, I then prepared the endplates for  arthrodesis.  I placed another 7-mm graft at that level filled with DBX  putty.   At this point, I removed the distraction pins.  I then prepared to place  instrumentation.  Two screws were placed in C5, two in C6, and two in C7.  This was done in a fashion so that the plate was secure.  All holes were  first drilled by hand and then self-tapping screws were used.  The first  screw went in at C6, the second at C5, the third at C7, the fourth at C7,  the fifth at C5, and the sixth screw and last at C6.  X-ray showed the  plate, screws, and plugs to be in good position.  I then irrigated the  wound.  I was able to achieve hemostasis.  I then closed the wound in a  layered fashion using Vicryl sutures.  Dermabond was used for a sterile  dressing.           ______________________________  Coletta Memos, M.D.     KC/MEDQ  D:  04/02/2006  T:  04/03/2006  Job:  [id number]

## 2010-10-26 NOTE — Discharge Summary (Signed)
   NAMERIDLEY, DILEO                           ACCOUNT NO.:  [id number]   MEDICAL RECORD NO.:  [id number]                   PATIENT TYPE:  INP   LOCATION:  3040                                 FACILITY:  [facility]   PHYSICIAN:  Coletta Memos, MD                    DATE OF BIRTH:  [birth year]   DATE OF ADMISSION:  03/08/2002  DATE OF DISCHARGE:  03/15/2002                                 DISCHARGE SUMMARY   ADMISSION DIAGNOSIS:  Right posterior frontal arteriovenous malformation.   DISCHARGE DIAGNOSIS:  Right posterior frontal arteriovenous malformation.   PROCEDURES:  1. Preoperative endovascular embolization of arteriovenous malformation.  2. Right parietal craniotomy for arteriovenous malformation resection with     microdissection.   COMPLICATIONS:  None.   DISCHARGE STATUS:  Weakness in the left upper extremity, greater weakness in  the left hand approximately 1/5. She should recover use of the hand in a few  months. Postoperative angiogram showed no residual arteriovenous  malformation.   HOSPITAL COURSE:  The patient is a 50 year old. She had an arteriovenous  malformation situated in ___________ brain, approximately 3 cm in size with  superficial drainage.  I, therefore, deemed that it was a resectable lesion  without undo morbidity. She did not wish to pursue stereotactic  radiosurgical consultation. Therefore, it was elected by myself to have her  admitted, embolized preoperatively, and then taken to the operating room for  definitive resection.   Her procedure went quite well, and there were no intraoperative problems.  Postoperatively, she did have weakness in the left arm and hand, hand being  worse than the arm and forearm. This was not unexpected given that the AVM  was located in the motor area. Postoperative angiogram showed no residual  AVM and normal venous drainage. Her wound is clean and dry without signs of  infection at discharge. I will give her Dilantin to  take for six weeks  postoperatively. I will have her come back to the office on October 14, for  suture removal. She is discharged home today in good condition. Mental  status is normal, cranial nerve examination is normal, and motor changes  were noted.                                               Coletta Memos, MD    KC/MEDQ  D:  03/15/2002  T:  03/17/2002  Job:  [id number]

## 2010-11-26 ENCOUNTER — Encounter: Payer: Self-pay | Admitting: *Deleted

## 2010-11-26 ENCOUNTER — Encounter: Payer: Self-pay | Admitting: Family Medicine

## 2010-12-17 ENCOUNTER — Encounter: Payer: Self-pay | Admitting: Family Medicine

## 2010-12-17 ENCOUNTER — Ambulatory Visit (INDEPENDENT_AMBULATORY_CARE_PROVIDER_SITE_OTHER): Payer: Managed Care, Other (non HMO) | Admitting: Family Medicine

## 2010-12-17 VITALS — BP 126/80 | HR 72 | Ht 68.0 in | Wt 155.0 lb

## 2010-12-17 DIAGNOSIS — E7800 Pure hypercholesterolemia, unspecified: Secondary | ICD-10-CM

## 2010-12-17 DIAGNOSIS — Z0000 Encounter for general adult medical examination without abnormal findings: Secondary | ICD-10-CM

## 2010-12-17 LAB — POCT URINALYSIS DIPSTICK
Bilirubin, UA: NEGATIVE
Leukocytes, UA: NEGATIVE
Nitrite, UA: NEGATIVE
Urobilinogen, UA: NEGATIVE

## 2010-12-17 NOTE — Patient Instructions (Signed)
Dry lips--continue beeswax, avoid licking lips, drink plenty of fluids Try and get exercise daily (at least 30-60 minutes 5 days/week)

## 2010-12-17 NOTE — Progress Notes (Signed)
Shannon [name] is a 50 y.o. female who presents for a complete physical.  She has the following concerns: peeling lips.  Has allergy to Lanolin, and had to change from her usual lip balm (b/c it changed formulation and now contains Lanolin).  Using Beeswax for about a week, and seems to be improving.  Has been off the Depo Provera since 09/2009, had a slight period 11/2009 and none since. +hot flashes (improved since taking Fibro Response)  Immunization History  Administered Date(s) Administered  . DTaP 02/21/2005   Last Pap smear: 08/2009 Last mammogram: 08/2009 Last colonoscopy: 04/20/07 (internal hemorrhoids, sessile polyp--no path report in chart, but pt states told repeat 10 yrs)-Dr. Loreta Ave Last DEXA: 03/19/07, normal Dentist: every 6 months Ophtho: 03/2010 Exercise: irregular Had lipids checked at work and cholesterol jumped to 220.  Wants it re-checked today  Past Medical History  Diagnosis Date  . IBS (irritable bowel syndrome)   . Benign hematuria     negative workup in past (in [place])  . Cerebral AVM [place]  . GERD (gastroesophageal reflux disease)     Past Surgical History  Procedure Date  . Brain surgery 03/2002    R posterior frontal AVM resection   . Spine surgery 2007    Anterior cervical decompression C5-C and C6-C7    History   Social History  . Marital Status: Single    Spouse Name: N/A    Number of Children: N/A  . Years of Education: N/A   Occupational History  . customer service    Social History Main Topics  . Smoking status: Former Smoker    Quit date: 10/08/2004  . Smokeless tobacco: Never Used  . Alcohol Use: Yes     1-2 glasses of wine daily.  . Drug Use: No  . Sexually Active: Not Currently   Other Topics Concern  . Not on file   Social History Narrative  . No narrative on file    Family History  Problem Relation Age of Onset  . Hypertension Father   . Diabetes Father   . Heart disease Maternal Grandmother   . Hypertension Mother     . Diabetes Sister   . Seizures Brother   . Cancer Paternal Aunt     breast    Current outpatient prescriptions:aspirin 81 MG tablet, Take 81 mg by mouth daily.  , Disp: , Rfl: ;  CALCIUM CITRATE PO, Take 1 tablet by mouth 2 (two) times daily.  , Disp: , Rfl: ;  Cholecalciferol (VITAMIN D) 1000 UNITS capsule, Take 1,000 Units by mouth daily.  , Disp: , Rfl: ;  famotidine (PEPCID) 10 MG tablet, Take 10 mg by mouth daily.  , Disp: , Rfl:  Glucos-MSM-C-Mn-Ginger-Willow (GLUCOSAMINE MSM COMPLEX PO), Take 1 tablet by mouth 2 (two) times daily.  , Disp: , Rfl: ;  Multiple Vitamins-Minerals (MULTIVITAMIN WITH MINERALS) tablet, Take 1 tablet by mouth daily.  , Disp: , Rfl: ;  Omega-3 Fatty Acids (FISH OIL) 1000 MG CAPS, Take 1 capsule by mouth daily.  , Disp: , Rfl: ;  vitamin C (ASCORBIC ACID) 500 MG tablet, Take 500 mg by mouth 2 (two) times daily.  , Disp: , Rfl:  vitamin k 100 MCG tablet, Take 50 mcg by mouth daily.  , Disp: , Rfl: ;  DISCONTD: ibuprofen (ADVIL,MOTRIN) 200 MG tablet, Take 800 mg by mouth every 6 (six) hours as needed.  , Disp: , Rfl: ;  DISCONTD: loratadine-pseudoephedrine (CLARITIN-D 12-HOUR) 5-120 MG per tablet, Take  1 tablet by mouth 2 (two) times daily.  , Disp: , Rfl:   Allergies  Allergen Reactions  . Latex Rash   ROS: The patient denies anorexia, fever, weight changes, headaches,  vision changes, decreased hearing, ear pain, sore throat, breast concerns, chest pain, palpitations, dizziness, syncope, dyspnea on exertion, cough, swelling, nausea, vomiting, diarrhea, constipation, abdominal pain, melena, hematochezia,   incontinence, dysuria, postmenopausal bleeding, vaginal discharge, odor or itch, genital lesions, joint pains, weakness, tremor, suspicious skin lesions, depression, anxiety, abnormal bleeding/bruising, or enlarged lymph nodes.  +Residual numbness L hand/arm (since brain surgery).  Reflux if doesn't take Pepcid (likes spicy food). H/o microscopic  hematuria  PHYSICAL EXAM: BP 126/80  Pulse 72  Ht 5\' 8"  (1.727 m)  Wt 155 lb (70.308 kg)  BMI 23.57 kg/m2  LMP 11/25/1997  General Appearance:    Alert, cooperative, no distress, appears stated age  Head:    Normocephalic, without obvious abnormality, atraumatic  Eyes:    PERRL, conjunctiva/corneas clear, EOM's intact, fundi    benign  Ears:    Normal TM's and external ear canals  Nose:   Nares normal, mucosa normal, no drainage or sinus   tenderness  Throat:   Lips, mucosa, and tongue normal; teeth and gums normal. No cheilitis or other lip abnormality noted  Neck:   Supple, no lymphadenopathy;  thyroid:  no   enlargement/tenderness/nodules; no carotid   bruit or JVD  Back:    Spine nontender, no curvature, ROM normal, no CVA     tenderness  Lungs:     Clear to auscultation bilaterally without wheezes, rales or     ronchi; respirations unlabored  Chest Wall:    No tenderness or deformity   Heart:    Regular rate and rhythm, S1 and S2 normal, no murmur, rub   or gallop  Breast Exam:    No tenderness, masses, or nipple discharge or inversion.      No axillary lymphadenopathy  Abdomen:     Soft, non-tender, nondistended, normoactive bowel sounds,    no masses, no hepatosplenomegaly  Genitalia:    Normal external genitalia without lesions.  BUS and vagina normal; no cervical motion tenderness.  Uterus and adnexa not enlarged, nontender, no masses.  Pap not performed (bimanual exam only)  Rectal:    Normal tone, no masses or tenderness; guaiac negative stool  Extremities:   No clubbing, cyanosis or edema  Pulses:   2+ and symmetric all extremities  Skin:   Skin color, texture, turgor normal, no rashes or lesions  Lymph nodes:   Cervical, supraclavicular, and axillary nodes normal  Neurologic:   CNII-XII intact, normal strength, sensation and gait; reflexes 2+ and symmetric throughout          Psych:   Normal mood, affect, hygiene and grooming.    ASSESSMENT/PLAN: 1. Routine general  medical examination at a health care facility  POCT urinalysis dipstick, Visual acuity screening, Glucose  2. Pure hypercholesterolemia  Lipid panel   Dry lips--continue beeswax, avoid licking lips, drink plenty of fluids.  Discussed monthly self breast exams and yearly mammograms after the age of 46; discussed that pap smears can be done every 3 years (but should come for exam annually); at least 30 minutes of aerobic activity at least 5 days/week; proper sunscreen use reviewed; healthy diet, including goals of calcium and vitamin D intake and alcohol recommendations (less than or equal to 1 drink/day) reviewed; regular seatbelt use; changing batteries in smoke detectors.  Immunization recommendations discussed--UTD;  flu shots annually.  Colonoscopy recommendations reviewed--UTD

## 2010-12-18 ENCOUNTER — Encounter: Payer: Self-pay | Admitting: Family Medicine

## 2010-12-18 LAB — LIPID PANEL
HDL: 79 mg/dL (ref >39)
LDL Cholesterol: 120 mg/dL — ABNORMAL HIGH (ref 0–99)
Total CHOL/HDL Ratio: 2.6 Ratio
Triglycerides: 47 mg/dL (ref <150)
VLDL: 9 mg/dL (ref 0–40)

## 2010-12-18 LAB — GLUCOSE, RANDOM: Glucose, Bld: 89 mg/dL (ref 70–99)

## 2011-07-16 ENCOUNTER — Encounter: Payer: Self-pay | Admitting: Internal Medicine

## 2011-07-19 ENCOUNTER — Ambulatory Visit (INDEPENDENT_AMBULATORY_CARE_PROVIDER_SITE_OTHER): Payer: Managed Care, Other (non HMO) | Admitting: Family Medicine

## 2011-07-19 ENCOUNTER — Encounter: Payer: Self-pay | Admitting: Family Medicine

## 2011-07-19 VITALS — BP 114/70 | HR 74 | Ht 68.5 in | Wt 159.0 lb

## 2011-07-19 DIAGNOSIS — D229 Melanocytic nevi, unspecified: Secondary | ICD-10-CM

## 2011-07-19 DIAGNOSIS — D239 Other benign neoplasm of skin, unspecified: Secondary | ICD-10-CM

## 2011-07-19 NOTE — Progress Notes (Signed)
  Subjective:    Patient ID: Shannon [name], female    DOB: [birth year], 51 y.o.   MRN: [id number]  HPI She is here to have several moles evaluated. One on her back has caused some itching and she has scratched it recently.   Review of Systems     Objective:   Physical Exam Exam of her torso shows no suspicious lesions on either the back or the chest and abdominal area.       Assessment & Plan:   1. Benign pigmented mole    I reassured her that I found nothing to worry about.

## 2011-11-05 HISTORY — PX: ORIF DISTAL RADIUS FRACTURE: SUR927

## 2011-12-11 ENCOUNTER — Encounter: Payer: Self-pay | Admitting: Family Medicine

## 2012-12-07 ENCOUNTER — Encounter: Payer: Self-pay | Admitting: Family Medicine

## 2012-12-07 ENCOUNTER — Ambulatory Visit (INDEPENDENT_AMBULATORY_CARE_PROVIDER_SITE_OTHER): Payer: Managed Care, Other (non HMO) | Admitting: Family Medicine

## 2012-12-07 VITALS — BP 114/70 | HR 70 | Wt 156.0 lb

## 2012-12-07 DIAGNOSIS — R079 Chest pain, unspecified: Secondary | ICD-10-CM

## 2012-12-07 MED ORDER — DICLOFENAC SODIUM 75 MG PO TBEC: 75.0000 mg | DELAYED_RELEASE_TABLET | Freq: Two times a day (BID) | ORAL | Status: DC

## 2012-12-07 NOTE — Progress Notes (Signed)
  Subjective:    Patient ID: Shannon [name], female    DOB: March 25, 1961, 52 y.o.   MRN: [id number]  HPI She has a six-day history of left axillary pain. She's had intermittent pain until she sneezed and then noted her intense pain in the axillary area. There is no history of injury. She did play golf the last 2 studies are noted no problems. She notes worsening the pain with deep breathing, coughing or sneezing. She also notices slight worsening in her numbness on the left. This has been constant since her previous brain surgery but does seem to be getting a little bit more noticeable. It is not related to coughing or sneezing. No chest pain, shortness of breath. She has been on Mobic for treatment of an underlying orthopedic condition which was started prior to the axillary pain.   Review of Systems     Objective:   Physical Exam Alert and in no distress. Motor function normal. Full range of motion of the shoulder. Axillary exam showed no palpable tenderness however she does have tenderness over the left mid rib area with positive compression as well. Lungs clear to auscultation.       Assessment & Plan:  Chest pain - Plan: diclofenac (VOLTAREN) 75 MG EC tablet  since she has not responded to the Mobic, I will switch her to Voltaren. If she continues to have difficulty, I will get an x-ray.

## 2012-12-07 NOTE — Patient Instructions (Signed)
Switch to Voltaren and let me know if you're still having symptoms after about a week or 2

## 2013-03-15 ENCOUNTER — Encounter: Payer: Self-pay | Admitting: Family Medicine

## 2013-03-15 ENCOUNTER — Other Ambulatory Visit (HOSPITAL_COMMUNITY)
Admission: RE | Admit: 2013-03-15 | Discharge: 2013-03-15 | Disposition: A | Discharge status: Home / Self Care | Payer: Managed Care, Other (non HMO) | Source: Ambulatory Visit | Attending: Family Medicine | Admitting: Family Medicine

## 2013-03-15 ENCOUNTER — Other Ambulatory Visit: Payer: Managed Care, Other (non HMO)

## 2013-03-15 ENCOUNTER — Ambulatory Visit (INDEPENDENT_AMBULATORY_CARE_PROVIDER_SITE_OTHER): Payer: Managed Care, Other (non HMO) | Admitting: Family Medicine

## 2013-03-15 VITALS — BP 120/68 | HR 76 | Ht 67.0 in | Wt 158.0 lb

## 2013-03-15 DIAGNOSIS — M25539 Pain in unspecified wrist: Secondary | ICD-10-CM

## 2013-03-15 DIAGNOSIS — E7800 Pure hypercholesterolemia, unspecified: Secondary | ICD-10-CM

## 2013-03-15 DIAGNOSIS — M7710 Lateral epicondylitis, unspecified elbow: Secondary | ICD-10-CM

## 2013-03-15 DIAGNOSIS — M25531 Pain in right wrist: Secondary | ICD-10-CM

## 2013-03-15 DIAGNOSIS — Z01419 Encounter for gynecological examination (general) (routine) without abnormal findings: Secondary | ICD-10-CM | POA: Insufficient documentation

## 2013-03-15 DIAGNOSIS — Z780 Asymptomatic menopausal state: Secondary | ICD-10-CM | POA: Insufficient documentation

## 2013-03-15 DIAGNOSIS — R599 Enlarged lymph nodes, unspecified: Secondary | ICD-10-CM

## 2013-03-15 DIAGNOSIS — R5381 Other malaise: Secondary | ICD-10-CM

## 2013-03-15 DIAGNOSIS — Z1151 Encounter for screening for human papillomavirus (HPV): Secondary | ICD-10-CM | POA: Insufficient documentation

## 2013-03-15 DIAGNOSIS — Z0000 Encounter for general adult medical examination without abnormal findings: Secondary | ICD-10-CM

## 2013-03-15 DIAGNOSIS — M7711 Lateral epicondylitis, right elbow: Secondary | ICD-10-CM

## 2013-03-15 DIAGNOSIS — Z23 Encounter for immunization: Secondary | ICD-10-CM

## 2013-03-15 LAB — COMPREHENSIVE METABOLIC PANEL
Albumin: 4.5 g/dL (ref 3.5–5.2)
CO2: 28 mEq/L (ref 19–32)
Glucose, Bld: 82 mg/dL (ref 70–99)
Potassium: 4.1 mEq/L (ref 3.5–5.3)
Sodium: 137 mEq/L (ref 135–145)
Total Bilirubin: 0.6 mg/dL (ref 0.3–1.2)
Total Protein: 6.6 g/dL (ref 6.0–8.3)

## 2013-03-15 LAB — POCT URINALYSIS DIPSTICK
Bilirubin, UA: NEGATIVE
Glucose, UA: NEGATIVE
Leukocytes, UA: NEGATIVE
Nitrite, UA: NEGATIVE
pH, UA: 6

## 2013-03-15 LAB — CBC WITH DIFFERENTIAL/PLATELET
Basophils Absolute: 0 10*3/uL (ref 0.0–0.1)
Basophils Relative: 1 % (ref 0–1)
HCT: 38.1 % (ref 36.0–46.0)
Lymphs Abs: 1.5 10*3/uL (ref 0.7–4.0)
MCH: 32.8 pg (ref 26.0–34.0)
MCHC: 34.6 g/dL (ref 30.0–36.0)
Neutro Abs: 2.8 10*3/uL (ref 1.7–7.7)
Neutrophils Relative %: 56 % (ref 43–77)
Platelets: 258 10*3/uL (ref 150–400)
RBC: 4.02 MIL/uL (ref 3.87–5.11)
RDW: 13.1 % (ref 11.5–15.5)

## 2013-03-15 LAB — LIPID PANEL
Cholesterol: 220 mg/dL — ABNORMAL HIGH (ref 0–200)
HDL: 91 mg/dL (ref >39)

## 2013-03-15 LAB — TSH: TSH: 0.925 u[IU]/mL (ref 0.350–4.500)

## 2013-03-15 LAB — HM PAP SMEAR: HM Pap smear: NEGATIVE

## 2013-03-15 NOTE — Patient Instructions (Addendum)
HEALTH MAINTENANCE RECOMMENDATIONS:  It is recommended that you get at least 30 minutes of aerobic exercise at least 5 days/week (for weight loss, you may need as much as 60-90 minutes). This can be any activity that gets your heart rate up. This can be divided in 10-15 minute intervals if needed, but try and build up your endurance at least once a week.  Weight bearing exercise is also recommended twice weekly.  Eat a healthy diet with lots of vegetables, fruits and fiber.  "Colorful" foods have a lot of vitamins (ie green vegetables, tomatoes, red peppers, etc).  Limit sweet tea, regular sodas and alcoholic beverages, all of which has a lot of calories and sugar.  Up to 1 alcoholic drink daily may be beneficial for women (unless trying to lose weight, watch sugars).  Drink a lot of water.  Calcium recommendations are 1200-1500 mg daily (1500 mg for postmenopausal women or women without ovaries), and vitamin D 1000 IU daily.  This should be obtained from diet and/or supplements (vitamins), and calcium should not be taken all at once, but in divided doses.  Monthly self breast exams and yearly mammograms for women over the age of 54 is recommended.  Sunscreen of at least SPF 30 should be used on all sun-exposed parts of the skin when outside between the hours of 10 am and 4 pm (not just when at beach or pool, but even with exercise, golf, tennis, and yard work!)  Use a sunscreen that says "broad spectrum" so it covers both UVA and UVB rays, and make sure to reapply every 1-2 hours.  Remember to change the batteries in your smoke detectors when changing your clock times in the spring and fall.  Use your seat belt every time you are in a car, and please drive safely and not be distracted with cell phones and texting while driving.  Consider tennis elbow band/strap. No evidence of carpal tunnel syndrome. You can try taking some aleve or ibuprofen or the diclofenac that you have from your back  pain--regularly for about a week to ten days.  I suspect the nodule on your left posterior neck is a benign lymph node, and should resolve.  Return for recheck if increasing in size.

## 2013-03-15 NOTE — Progress Notes (Signed)
Chief Complaint  Patient presents with  . Annual Exam    annual exam with pap(labs drawn this am and are in lab). 2+ blood in urine and patient is asymptomatic. Would like to know if she should get shingles vaccine. Has a lump on her neck(left side) that she would like you to look at. Has been having lots of arthritic pain in her knees and her hands also. 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   Shannon [name] is a 52 y.o. female who presents for a complete physical.  She has the following concerns:  Towards the end of the week she is having pain on the back of her right wrist.  Denies numbness/tingling.  Feels like she has some arthritis in her fingers. Some pain also at right lateral elbow.  She recently started playing golf again.  Pea-sized lump on the back of her neck on the left side.  Tender if she tries to massage it, otherwise not painful.  Noticed a couple of weeks ago--possibly a little flatter/smaller than when first noted.  She wants to lose 10 pounds and has been cutting back on alcohol to just weekends.  Also started some weight training recently.  Immunization History  Administered Date(s) Administered  . Influenza,inj,Quad PF,36+ Mos 03/15/2013  . Td 02/21/2005  Last Pap smear: 08/2009; not currently in a sexual relationship.  Denies STD risks.  Last mammogram: 03/2012  Last colonoscopy: 04/20/07 (internal hemorrhoids, sessile polyp--no path report in chart, but pt states told repeat 10 yrs)-Dr. Loreta Ave  Last DEXA: 03/19/07, normal  Dentist: every 6 months  Ophtho: 3 months ago Exercise: walks twice daily at work, on her breaks.  Has stationary bike at home which she does if she can't walk at work, and doing some strength training. Hasn't had a period in about 15 years--perhaps just a couple of periods after coming off of Depo Provera.   Past Medical History  Diagnosis Date  . IBS (irritable bowel syndrome)   . Benign hematuria     negative workup in  past (in [place])  . Cerebral AVM     Dr. Franky Macho  . GERD (gastroesophageal reflux disease)     Past Surgical History  Procedure Laterality Date  . Brain surgery  03/2002    R posterior frontal AVM resection   . Spine surgery  2007    Anterior cervical decompression C5-C6 and C6-C7  . Orif distal radius fracture Left 11/05/11    Dr. Dion Saucier    History   Social History  . Marital Status: Single    Spouse Name: N/A    Number of Children: N/A  . Years of Education: N/A   Occupational History  . customer service    Social History Main Topics  . Smoking status: Former Smoker    Quit date: 10/08/2004  . Smokeless tobacco: Never Used  . Alcohol Use: Yes     Comment: 1-2 drinks per day (beer or wine or bourbon and water--1 shot of bourbon)  . Drug Use: No  . Sexual Activity: Not Currently    Partners: Female   Other Topics Concern  . Not on file   Social History Narrative   Lives with 2 dogs    Family History  Problem Relation Age of Onset  . Hypertension Father   . Diabetes Father   . Heart disease Maternal Grandmother   . Hypertension Mother   . Diabetes Sister   . Seizures Brother   .  Cancer Paternal Aunt     breast  . Prostate cancer Cousin     Current outpatient prescriptions:aspirin 81 MG tablet, Take 81 mg by mouth daily.  , Disp: , Rfl: ;  CALCIUM CITRATE PO, Take 1 tablet by mouth 2 (two) times daily.  , Disp: , Rfl: ;  Cholecalciferol (VITAMIN D) 1000 UNITS capsule, Take 1,000 Units by mouth daily.  , Disp: , Rfl: ;  famotidine (PEPCID) 10 MG tablet, Take 10 mg by mouth daily.  , Disp: , Rfl:  Multiple Vitamins-Minerals (MULTIVITAMIN WITH MINERALS) tablet, Take 1 tablet by mouth daily.  , Disp: , Rfl: ;  Omega-3 Fatty Acids (FISH OIL) 1000 MG CAPS, Take 1 capsule by mouth daily.  , Disp: , Rfl: ;  vitamin C (ASCORBIC ACID) 500 MG tablet, Take 500 mg by mouth 2 (two) times daily.  , Disp: , Rfl: ;  vitamin k 100 MCG tablet, Take 50 mcg by mouth daily.  , Disp: ,  Rfl:  diclofenac (VOLTAREN) 75 MG EC tablet, Take 1 tablet (75 mg total) by mouth 2 (two) times daily., Disp: 30 tablet, Rfl: 0  Allergies  Allergen Reactions  . Latex Rash   ROS: The patient denies anorexia, fever, weight changes, headaches, vision changes, decreased hearing, ear pain, sore throat, breast concerns, chest pain, palpitations, dizziness, syncope, dyspnea on exertion, cough, swelling, nausea, vomiting, diarrhea, constipation, abdominal pain, melena, hematochezia, incontinence, dysuria, postmenopausal bleeding, vaginal discharge, odor or itch, genital lesions, joint pains, weakness, tremor, suspicious skin lesions, depression, anxiety, abnormal bleeding/bruising, or enlarged lymph nodes. Hemorrhoids occasionally flare. +Residual numbness L hand/arm (since brain surgery). This remains unchanged.  Denies any weakness, but her motor skills aren't as good in that hand (noticed in her typing).  Reflux if doesn't take Pepcid (likes spicy food). H/o microscopic hematuria.  Mild hot flashes, tolerable, no night sweats.   PHYSICAL EXAM:  BP 132/70  Pulse 76  Ht 5\' 7"  (1.702 m)  Wt 158 lb (71.668 kg)  BMI 24.74 kg/m2  LMP 11/25/1997 120/68 on repeat by MD  General Appearance:  Alert, cooperative, no distress, appears stated age   Head:  Normocephalic, without obvious abnormality, atraumatic   Eyes:  PERRL, conjunctiva/corneas clear, EOM's intact, fundi  benign   Ears:  Normal TM's and external ear canals   Nose:  Nares normal, mucosa normal, no drainage or sinus tenderness   Throat:  Lips, mucosa, and tongue normal; teeth and gums normal. No cheilitis or other lip abnormality noted   Neck:  Supple, normal ROM; thyroid: no enlargement/tenderness/nodules; no carotid  bruit or JVD.  <0.5 cm rubbery subcutaneous mass consistent with small lymph node posteriorly on left nontender.  No other lymphadenopathy noted.  Back:  Spine nontender, no curvature, ROM normal, no CVA tenderness    Lungs:  Clear to auscultation bilaterally without wheezes, rales or ronchi; respirations unlabored   Chest Wall:  No tenderness or deformity   Heart:  Regular rate and rhythm, S1 and S2 normal, no murmur, rub  or gallop   Breast Exam:  No tenderness, masses, or nipple discharge or inversion. No axillary lymphadenopathy.  Right nipple is a little more pearly, slight erythema, nontender.     Abdomen:  Soft, non-tender, nondistended, normoactive bowel sounds,  no masses, no hepatosplenomegaly   Genitalia:  Normal external genitalia without lesions. BUS and vagina normal; no cervical motion tenderness. Cervix without lesions.  Uterus and adnexa not enlarged, nontender, no masses. Pap  performed  Rectal:  Normal  tone, no masses or tenderness; guaiac negative stool   Extremities:  No clubbing, cyanosis or edema.  R wrist--negative Phalen's and Tinel.  FROM.  No swelling or warmth.  Mild tenderness over right lateral epicondyle  Pulses:  2+ and symmetric all extremities   Skin:  Skin color, texture, turgor normal, no rashes or lesions. Many tattoos   Lymph nodes:  Cervical, supraclavicular, and axillary nodes normal. +posterior lymph node on left as per cervical exam   Neurologic:  CNII-XII intact, normal strength, sensation and gait; reflexes 2+ and symmetric throughout          Psych: Normal mood, affect, hygiene and grooming.   ASSESSMENT/PLAN:   LAD L neck--expect to resolve.    ASSESSMENT/PLAN:  Routine general medical examination at a health care facility - Plan: POCT Urinalysis Dipstick, Visual acuity screening, Lipid panel, Comprehensive metabolic panel, CBC with Differential, TSH, Cytology - PAP [place]  Need for prophylactic vaccination and inoculation against influenza - Plan: Flu Vaccine QUAD 36+ mos IM  Other malaise and fatigue - Plan: Comprehensive metabolic panel, CBC with Differential, TSH  Pure hypercholesterolemia - Plan: Lipid panel  Wrist pain, right - no evidence  of carpal tunnel.    Lateral epicondylitis of elbow, right  Enlargement of lymph nodes - L posterior chain, <0.5cm, likely reactive and expected to resolve spontaneously.  return for recheck if increasing in size  Discussed monthly self breast exams and yearly mammograms after the age of 7; pap performed--if normal and negative for high risk HPV, plan to repeat in 5 years (but should come for exam annually); at least 30 minutes of aerobic activity at least 5 days/week; proper sunscreen use reviewed; healthy diet, including goals of calcium and vitamin D intake and alcohol recommendations (less than or equal to 1 drink/day) reviewed; regular seatbelt use; changing batteries in smoke detectors. Immunization recommendations discussed--UTD; flu shots annually. Colonoscopy recommendations reviewed--UTD    Reviewed risks/benefits re: zostavax--check with insurance, likely will need to wait until age 70.  If covered now, needs to wait a month due to being given flu shot today.  Wrist pain and lateral epicondylitis--recommend use of NSAID's, tennis elbow strap prn.  F/u if persistent/worsening

## 2013-03-22 ENCOUNTER — Encounter: Payer: Self-pay | Admitting: Internal Medicine

## 2013-03-24 LAB — HM MAMMOGRAPHY

## 2013-12-29 ENCOUNTER — Encounter: Payer: Self-pay | Admitting: Family Medicine

## 2013-12-29 ENCOUNTER — Ambulatory Visit (INDEPENDENT_AMBULATORY_CARE_PROVIDER_SITE_OTHER): Payer: BC Managed Care – PPO | Admitting: Family Medicine

## 2013-12-29 VITALS — BP 110/68 | HR 72 | Ht 67.0 in | Wt 144.0 lb

## 2013-12-29 DIAGNOSIS — M542 Cervicalgia: Secondary | ICD-10-CM

## 2013-12-29 DIAGNOSIS — M79604 Pain in right leg: Secondary | ICD-10-CM

## 2013-12-29 DIAGNOSIS — M79609 Pain in unspecified limb: Secondary | ICD-10-CM

## 2013-12-29 DIAGNOSIS — M62838 Other muscle spasm: Secondary | ICD-10-CM

## 2013-12-29 MED ORDER — DICLOFENAC SODIUM 75 MG PO TBEC: 75.0000 mg | DELAYED_RELEASE_TABLET | Freq: Two times a day (BID) | ORAL | Status: DC | PRN

## 2013-12-29 MED ORDER — METAXALONE 800 MG PO TABS: 800.0000 mg | ORAL_TABLET | Freq: Three times a day (TID) | ORAL | Status: DC | PRN

## 2013-12-29 NOTE — Patient Instructions (Signed)
Take the anti-inflammatory twice daily with food, regularly until pain has resolved. Stop ibuprofen/motrin while taking the prescription medication. Use heat, stretches as shown, and massage (if able) at least twice daily. Take breaks and do some neck stretches at work  Use the muscle relaxant as needed for spasm.

## 2013-12-29 NOTE — Progress Notes (Signed)
Chief Complaint  Patient presents with  . Shoulder Pain    pain between shoulders, more on the left side. Tingling and numbness/spasm down back of right leg.    Gradual onset of pain over the last week--described as tension in her shoulders, and feels like burning discomfort like a muscle spasm in a focal area to the left of her spine, between the spine and the shoulder blade on the left.  Intermittently there is sharp, short-lived stabbing pain.  She denies any change in activity or known injury.  She sits at a desk at work.    She has tried ibuprofen 400mg  in the morning, 600mg  at night, using sporadically, just x 3 days.  No significant improvement.  She has tried heat x 2 nights, with temporary improvement.  She has some decreased sensation in the LUE since her prior neck surgery.  There is some radiation of stabbing pain to just below the left shoulder, but not radiating down the arm.  She thinks there might be some more numbness than normal.  Denies any weakness.  She has been walking daily.  She has a numbness/spasm in her right lower hamstrings (just behind the knee, slightly above it), radiating into the calf.  Denies any leg swelling.  Sligjht tingling/numbness only in that focal area.  Past Medical History  Diagnosis Date  . IBS (irritable bowel syndrome)   . Benign hematuria     negative workup in past (in [place])  . Cerebral AVM     Dr. Christella Noa  . GERD (gastroesophageal reflux disease)    Past Surgical History  Procedure Laterality Date  . Brain surgery  03/2002    R posterior frontal AVM resection   . Spine surgery  2007    Anterior cervical decompression C5-C6 and C6-C7  . Orif distal radius fracture Left 11/05/11    Dr. Mardelle Matte   History   Social History  . Marital Status: Single    Spouse Name: N/A    Number of Children: N/A  . Years of Education: N/A   Occupational History  . customer service    Social History Main Topics  . Smoking status: Former Smoker   Quit date: 10/08/2004  . Smokeless tobacco: Never Used  . Alcohol Use: Yes     Comment: 1-2 drinks per day (beer or wine or bourbon and water--1 shot of bourbon)  . Drug Use: No  . Sexual Activity: Not Currently    Partners: Female   Other Topics Concern  . Not on file   Social History Narrative   Lives with 2 dogs   Current Outpatient Prescriptions on File Prior to Visit  Medication Sig Dispense Refill  . aspirin 81 MG tablet Take 81 mg by mouth daily.        Marland Kitchen CALCIUM CITRATE PO Take 1 tablet by mouth 2 (two) times daily.        . Cholecalciferol (VITAMIN D) 1000 UNITS capsule Take 1,000 Units by mouth daily.        . famotidine (PEPCID) 10 MG tablet Take 10 mg by mouth daily.        . Multiple Vitamins-Minerals (MULTIVITAMIN WITH MINERALS) tablet Take 1 tablet by mouth daily.        . Omega-3 Fatty Acids (FISH OIL) 1000 MG CAPS Take 1 capsule by mouth daily.        . vitamin C (ASCORBIC ACID) 500 MG tablet Take 500 mg by mouth 2 (two) times daily.  No current facility-administered medications on file prior to visit.   Allergies  Allergen Reactions  . Latex Rash   ROS:  Denies fevers, chills, bleeding/bruising, rash, swelling, nausea, vomiting abdominal pain.  Denies weakness.  +numbness/tingling per HPI.  Denies headaches, dizziness.  PHYSICAL EXAM: BP 110/68  Pulse 72  Ht 5\' 7"  (1.702 m)  Wt 144 lb (65.318 kg)  BMI 22.55 kg/m2  LMP 11/25/1997 Well developed, pleasant female, who does not appear to be in any distress  Neck: no c-spine tenderness. FROM.  No lymphadenopathy No significant tenderness or spasm of trapezius muscles.  There is palpable spasm and tenderness at rhomboid muscles on the left Spine--nontender.   Back: No CVA tenderness RLE:  No Baker's cyst, cords.  Calves nontender.  nontender to palpation in area of her discomfort.  2+ pulse Neuro: alert and oriented.  Cranial nerves are intact.  Normal strength, sensation, gait.  DTR's are 2+  bilaterally Psych: normal mood, affect, hygiene and grooming   ASSESSMENT/PLAN:  Muscle spasm - of left rhomboids.  NSAIDs, muscle relaxants prn. Risks/side effects reviewed in detail - Plan: metaxalone (SKELAXIN) 800 MG tablet  Neck pain - Plan: diclofenac (VOLTAREN) 75 MG EC tablet  Pain of right leg - normal exam reassurred.  discussed proper stretching, shoes with walking.  NSAIDs should help  F/u prn persistent/worsening symptoms

## 2014-03-25 ENCOUNTER — Other Ambulatory Visit: Payer: Self-pay

## 2014-03-31 ENCOUNTER — Encounter: Payer: Self-pay | Admitting: Family Medicine

## 2014-03-31 ENCOUNTER — Ambulatory Visit (INDEPENDENT_AMBULATORY_CARE_PROVIDER_SITE_OTHER): Payer: BC Managed Care – PPO | Admitting: Family Medicine

## 2014-03-31 VITALS — BP 118/72 | HR 64 | Ht 67.0 in | Wt 144.0 lb

## 2014-03-31 DIAGNOSIS — M545 Low back pain: Secondary | ICD-10-CM

## 2014-03-31 DIAGNOSIS — Z0000 Encounter for general adult medical examination without abnormal findings: Secondary | ICD-10-CM

## 2014-03-31 DIAGNOSIS — Z23 Encounter for immunization: Secondary | ICD-10-CM

## 2014-03-31 LAB — POCT URINALYSIS DIPSTICK
BILIRUBIN UA: NEGATIVE
Glucose, UA: NEGATIVE
KETONES UA: NEGATIVE
LEUKOCYTES UA: NEGATIVE
NITRITE UA: NEGATIVE
PH UA: 7
Protein, UA: NEGATIVE
Spec Grav, UA: <=1.005
Urobilinogen, UA: NEGATIVE

## 2014-03-31 LAB — HM MAMMOGRAPHY

## 2014-03-31 MED ORDER — METHOCARBAMOL 500 MG PO TABS: 500.0000 mg | ORAL_TABLET | Freq: Four times a day (QID) | ORAL | Status: DC | PRN

## 2014-03-31 MED ORDER — DICLOFENAC SODIUM 75 MG PO TBEC: 75.0000 mg | DELAYED_RELEASE_TABLET | Freq: Two times a day (BID) | ORAL | Status: DC | PRN

## 2014-03-31 NOTE — Progress Notes (Signed)
Chief Complaint  Patient presents with  . Annual Exam    fasting annual exam with pelvic (pap done 10/14, normal) UA showed 1+ blood, no symptoms. B/L hip pain that shoots down her hips that started this past Sunday 03/27/14.    Shannon [name] is a 53 y.o. female who presents for a complete physical.  She has the following concerns:  Seen in July with muscle spasm of L rhomboids.  Pain is significantly better, but still has occasional mild discomfort (especially if doing weights, so has backed off).  While walking [place] the golf [place] (prior to even start playing) on Sunday she had acute onset of pain in her right back, shooting across lower hip/upper buttock around to the lateral hip. She continued to play the full par 3 course. She has had some radiation to the right medial thigh, and also feels some tension in her knee joint.  She has been taking ibuprofen (400mg  three times on Sunday, twice daily on Monday and Tuesday, but also took a leftover oxycodone the last couple of nights).  She tried some heat Monday night, temporarily helped. The pain now seems to shoot across to the left side.  She is feeling tightness into her right paraspinous muscles, and having some pain in lower back/upper buttock bilaterally.  Denies any numbness, tingling or weakness into the leg. Now bowel or bladder dysfunction. She has done some stretching.   Immunization History  Administered Date(s) Administered  . Influenza,inj,Quad PF,36+ Mos 03/15/2013  . Td 02/21/2005   Last Pap smear: 03/2013--normal, with no high risk HPV  Last mammogram: 03/2013, scheduled for today Last colonoscopy: 04/20/07 (internal hemorrhoids, sessile polyp--no path report in chart, but pt states told repeat 10 yrs)-Dr. Collene Mares  Last DEXA: 03/19/07, normal  Dentist: every 6 months  Ophtho: every other year (summer 2014) Exercise: walks twice daily at work, on her breaks. Has treadmill at home which she does if she can't walk at work, and doing  some strength training--once a week (flares up her neck/shoulder pain).  Hasn't had a period in over 15 years--perhaps just a couple of periods after coming off of Depo Provera.   Past Medical History  Diagnosis Date  . IBS (irritable bowel syndrome)   . Benign hematuria     negative workup in past (in [place])  . Cerebral AVM     Dr. Christella Noa  . GERD (gastroesophageal reflux disease)     Past Surgical History  Procedure Laterality Date  . Brain surgery  03/2002    R posterior frontal AVM resection   . Spine surgery  2007    Anterior cervical decompression C5-C6 and C6-C7  . Orif distal radius fracture Left 11/05/11    Dr. Mardelle Matte    History   Social History  . Marital Status: Single    Spouse Name: N/A    Number of Children: N/A  . Years of Education: N/A   Occupational History  . customer service    Social History Main Topics  . Smoking status: Former Smoker    Quit date: 10/08/2004  . Smokeless tobacco: Never Used  . Alcohol Use: Yes     Comment: 2 drinks per day (bourbon and water--1 shot of bourbon)  . Drug Use: No  . Sexual Activity: Not Currently    Partners: Female   Other Topics Concern  . Not on file   Social History Narrative   Lives with 3 dogs    Family History  Problem Relation Age  of Onset  . Hypertension Father   . Diabetes Father   . Heart disease Maternal Grandmother   . Hypertension Mother   . Transient ischemic attack Mother   . Thyroid disease Mother   . Diabetes Sister   . Thyroid disease Sister   . Seizures Brother     childhood; resolved  . Cancer Paternal Aunt     breast  . Breast cancer Paternal Aunt     43's  . Prostate cancer Cousin   . Cancer Cousin     Outpatient Encounter Prescriptions as of 03/31/2014  Medication Sig  . aspirin 81 MG tablet Take 81 mg by mouth daily.    Marland Kitchen CALCIUM CITRATE PO Take 1 tablet by mouth 2 (two) times daily.    . Cholecalciferol (VITAMIN D) 1000 UNITS capsule Take 1,000 Units by mouth daily.     . Cyanocobalamin (B-12) 100 MCG TABS Take 1 tablet by mouth daily.  . famotidine (PEPCID) 10 MG tablet Take 10 mg by mouth daily.    Marland Kitchen glucosamine-chondroitin 500-400 MG tablet Take 2 tablets by mouth daily.  . Multiple Vitamins-Minerals (MULTIVITAMIN WITH MINERALS) tablet Take 1 tablet by mouth daily.    . Omega-3 Fatty Acids (FISH OIL) 1000 MG CAPS Take 1 capsule by mouth 2 (two) times daily.   . vitamin C (ASCORBIC ACID) 500 MG tablet Take 500 mg by mouth 2 (two) times daily.    . diclofenac (VOLTAREN) 75 MG EC tablet Take 1 tablet (75 mg total) by mouth 2 (two) times daily as needed for moderate pain.  . methocarbamol (ROBAXIN) 500 MG tablet Take 1-2 tablets (500-1,000 mg total) by mouth every 6 (six) hours as needed for muscle spasms.  . [DISCONTINUED] diclofenac (VOLTAREN) 75 MG EC tablet Take 1 tablet (75 mg total) by mouth 2 (two) times daily as needed for moderate pain.  . [DISCONTINUED] metaxalone (SKELAXIN) 800 MG tablet Take 1 tablet (800 mg total) by mouth every 8 (eight) hours as needed for muscle spasms.    Allergies  Allergen Reactions  . Lanolin Rash  . Latex Rash   ROS: The patient denies anorexia, fever, weight changes, headaches, vision changes, decreased hearing, ear pain, sore throat, breast concerns, chest pain, palpitations, dizziness, syncope, dyspnea on exertion, cough, swelling, nausea, vomiting, diarrhea, constipation, abdominal pain, melena, hematochezia, incontinence, dysuria, postmenopausal bleeding, vaginal discharge, odor or itch, genital lesions, weakness, tremor, suspicious skin lesions, depression, anxiety, abnormal bleeding/bruising, or enlarged lymph nodes. Hemorrhoids occasionally flare.  +Residual numbness L hand/arm (since brain surgery). This remains unchanged. Denies any weakness, but her motor skills aren't as good in that hand (noticed in her typing).  Reflux if doesn't take Pepcid (likes spicy food). H/o microscopic hematuria--no visible hematuria or  other urinary complaints. Denies hot flashes, night sweats. +14 pound intentional weight loss since last year. Occasional foot/ankle cramps at night Mild congestion in her ears/sinuses. Stools are unchanged--loose in the mornings. Hip/back pain as per HPI   PHYSICAL EXAM: BP 118/72  Pulse 64  Ht 5\' 7"  (1.702 m)  Wt 144 lb (65.318 kg)  BMI 22.55 kg/m2  LMP 11/25/1997  General Appearance:  Alert, cooperative, no distress, appears stated age   Head:  Normocephalic, without obvious abnormality, atraumatic   Eyes:  PERRL, conjunctiva/corneas clear, EOM's intact, fundi  benign   Ears:  Normal TM's and external ear canals   Nose:  Nares normal, mucosa normal, no drainage or sinus tenderness   Throat:  Lips, mucosa, and tongue normal; teeth  and gums normal.   Neck:  Supple, normal ROM; thyroid: no enlargement/tenderness/nodules; no carotid  bruit or JVD.   Back:  Spine nontender, no curvature, ROM normal, no CVA tenderness. No SI joint tenderness.  No muscle spasm  Lungs:  Clear to auscultation bilaterally without wheezes, rales or ronchi; respirations unlabored   Chest Wall:  No tenderness or deformity   Heart:  Regular rate and rhythm, S1 and S2 normal, no murmur, rub  or gallop   Breast Exam:  No tenderness, masses, or nipple discharge or inversion. No axillary lymphadenopathy  Abdomen:  Soft, non-tender, nondistended, normoactive bowel sounds,  no masses, no hepatosplenomegaly   Genitalia:  Normal external genitalia without lesions. BUS and vagina normal; no cervical motion tenderness. Uterus and adnexa not enlarged, nontender, no masses. Pap not performed   Rectal:  Normal tone, no masses or tenderness; guaiac negative stool   Extremities:  No clubbing, cyanosis or edema.   Pulses:  2+ and symmetric all extremities   Skin:  Skin color, texture, turgor normal, no rashes or lesions. Many tattoos   Lymph nodes:  Cervical, supraclavicular, and axillary nodes normal.   Neurologic:   CNII-XII intact, normal strength, sensation and gait; reflexes 2+ and symmetric throughout          Psych: Normal mood, affect, hygiene and grooming.   ASSESSMENT/PLAN:  Annual physical exam - Plan: Visual acuity screening, POCT Urinalysis Dipstick  Immunization due - Plan: Tdap vaccine greater than or equal to 7yo IM  Low back pain without sciatica, unspecified back pain laterality - normal exam, no spasm noted, but likely has some intermittently.  Skelaxin too expensive, change to Robaxin.  Risks/side effects of meds reviewed - Plan: diclofenac (VOLTAREN) 75 MG EC tablet, methocarbamol (ROBAXIN) 500 MG tablet  Discussed monthly self breast exams and yearly mammograms after the age of 55; pap not performed--due again in 2019; at least 30 minutes of aerobic activity at least 5 days/week; proper sunscreen use reviewed; healthy diet, including goals of calcium and vitamin D intake and alcohol recommendations (less than or equal to 1 drink/day) reviewed; regular seatbelt use; changing batteries in smoke detectors. Immunization recommendations discussed--TdaP today; continue flu shots annually. Colonoscopy recommendations reviewed--UTD   Labs reviewed from last year.  None needed today based on negative ROS.  F/u 1 year for CPE, sooner prn

## 2014-03-31 NOTE — Patient Instructions (Addendum)
  HEALTH MAINTENANCE RECOMMENDATIONS:  It is recommended that you get at least 30 minutes of aerobic exercise at least 5 days/week (for weight loss, you may need as much as 60-90 minutes). This can be any activity that gets your heart rate up. This can be divided in 10-15 minute intervals if needed, but try and build up your endurance at least once a week.  Weight bearing exercise is also recommended twice weekly.  Eat a healthy diet with lots of vegetables, fruits and fiber.  "Colorful" foods have a lot of vitamins (ie green vegetables, tomatoes, red peppers, etc).  Limit sweet tea, regular sodas and alcoholic beverages, all of which has a lot of calories and sugar.  Up to 1 alcoholic drink daily may be beneficial for women (unless trying to lose weight, watch sugars).  Drink a lot of water.  Calcium recommendations are 1200-1500 mg daily (1500 mg for postmenopausal women or women without ovaries), and vitamin D 1000 IU daily.  This should be obtained from diet and/or supplements (vitamins), and calcium should not be taken all at once, but in divided doses.  Monthly self breast exams and yearly mammograms for women over the age of 77 is recommended.  Sunscreen of at least SPF 30 should be used on all sun-exposed parts of the skin when outside between the hours of 10 am and 4 pm (not just when at beach or pool, but even with exercise, golf, tennis, and yard work!)  Use a sunscreen that says "broad spectrum" so it covers both UVA and UVB rays, and make sure to reapply every 1-2 hours.  Remember to change the batteries in your smoke detectors when changing your clock times in the spring and fall.  Use your seat belt every time you are in a car, and please drive safely and not be distracted with cell phones and texting while driving.  Continue heat, stretches. Proper lifting. Use the diclofenac and muscle relaxant as needed.  Expect complete resolution within 1-2 weeks.

## 2014-04-11 ENCOUNTER — Encounter: Payer: Self-pay | Admitting: Internal Medicine

## 2014-05-17 ENCOUNTER — Encounter: Payer: Self-pay | Admitting: Family Medicine

## 2014-05-17 ENCOUNTER — Ambulatory Visit (INDEPENDENT_AMBULATORY_CARE_PROVIDER_SITE_OTHER): Payer: BC Managed Care – PPO | Admitting: Family Medicine

## 2014-05-17 VITALS — BP 128/80 | HR 74 | Temp 98.6°F | Wt 146.0 lb

## 2014-05-17 DIAGNOSIS — J0100 Acute maxillary sinusitis, unspecified: Secondary | ICD-10-CM

## 2014-05-17 MED ORDER — AMOXICILLIN 875 MG PO TABS: 875.0000 mg | ORAL_TABLET | Freq: Two times a day (BID) | ORAL | Status: DC

## 2014-05-17 NOTE — Patient Instructions (Signed)
Take all the antibiotic and if not totally back to normal when you finish the give me a call. Try Afrin nasal spray at night sleeping brief review

## 2014-05-17 NOTE — Progress Notes (Signed)
   Subjective:    Patient ID: Shannon [name], female    DOB: 1960-08-05, 53 y.o.   MRN: [id number]  HPI He has a ten-day history that started with sinus pressure , sore throat and malaise. She did slightly get better but then approximately 6 days ago the sore throat got worse as well as the nasal congestion, purulent PND, headache, earache. She has tried OTC meds without success. She does not smoke and has no underlying allergies. She also notes upper tooth discomfort especially on the left   Review of Systems     Objective:   Physical Exam alert and in no distress. Tympanic membranes and canals are normal. Throat is clear. Tonsils are normal. Neck is supple without adenopathy or thyromegaly. Nasal mucosa is red with tenderness especially over left maxillary sinus. Cardiac exam shows a regular sinus rhythm without murmurs or gallops. Lungs are clear to auscultation.        Assessment & Plan:  Acute maxillary sinusitis, recurrence not specified - Plan: amoxicillin (AMOXIL) 875 MG tablet  She is to call if not entirely better when she finishes the antibiotic and also use Afrin nasal spray at night.

## 2014-05-24 ENCOUNTER — Encounter: Payer: Self-pay | Admitting: Family Medicine

## 2014-05-27 ENCOUNTER — Other Ambulatory Visit: Payer: Self-pay | Admitting: Family Medicine

## 2014-05-27 DIAGNOSIS — J0100 Acute maxillary sinusitis, unspecified: Secondary | ICD-10-CM

## 2014-05-27 MED ORDER — AMOXICILLIN 875 MG PO TABS: 875.0000 mg | ORAL_TABLET | Freq: Two times a day (BID) | ORAL | Status: DC

## 2014-10-12 ENCOUNTER — Encounter: Payer: Self-pay | Admitting: Family Medicine

## 2014-10-12 ENCOUNTER — Ambulatory Visit (INDEPENDENT_AMBULATORY_CARE_PROVIDER_SITE_OTHER): Payer: BLUE CROSS/BLUE SHIELD | Admitting: Family Medicine

## 2014-10-12 VITALS — BP 120/66 | HR 60 | Ht 67.0 in | Wt 145.4 lb

## 2014-10-12 DIAGNOSIS — M5416 Radiculopathy, lumbar region: Secondary | ICD-10-CM | POA: Diagnosis not present

## 2014-10-12 MED ORDER — METHYLPREDNISOLONE 4 MG PO TBPK: ORAL_TABLET | ORAL | Status: DC

## 2014-10-12 NOTE — Patient Instructions (Signed)
  Take the steroid pack as directed.  Do not use ibuprofen or other NSAID's along with it. You may use tylenol along with it. If/when your discomfort returns, rather than refilling and continuing on steroids, we should change to a prescription anti-inflammatory (ie naproxen twice daily or mobic once daily)  We are referring you to physical therapy.  If you develop any weakness, bowel or bladder problems, please let us know immediately.  If you aren't improving with physical therapy, then we will need to get an MRI.

## 2014-10-12 NOTE — Progress Notes (Signed)
Chief Complaint  Patient presents with  . Back Pain    lbp pain since day after Christmas that comes and goes. Over the last 2 weeks she has now had pain that shoots down to her hamstrings-left worse tha right.    Pain started after Christmas, after being in car for 4 hours (back/forth to [place]).  She thinks she may have aggravated her lower back doing sit-ups around that time.  She has been having pain off/on ever since then.    She has been seeing a Restaurant manager, fast food for 3 months.  This has helped some, she has continued doing stretches, and stopped doing sit-ups.  She would see improvement, then pain would recur.   Pain feels like a disc-type pain, but then has been having sciatica pain radiating down the back of her left leg, almost to her knee.  Also has tightness into her calf.  She does have some tingling in her toes.  Denies any weakness.  Denies any bowel/bladder problems.   She has h/o herniated disks in the past, which required steroid injections (done in 2007-08).  She used to be under the care of Dr. Christella Noa.  She also used to see Dr. Lynann Bologna.  The chiropractor has not been using electrical stimulation/TENS (she has used in the past) or traction.  She took her dog's prednisone a month or so ago, and that seemed to help.  Otherwise she has been taking ibuprofen 400mg  three times daily, and has taken Robaxin without much benefit.  She wants to be able to golf on her birthday next week.  PMH, PSH, SH reviewed  Current Outpatient Prescriptions on File Prior to Visit  Medication Sig Dispense Refill  . aspirin 81 MG tablet Take 81 mg by mouth daily.      Marland Kitchen CALCIUM CITRATE PO Take 1 tablet by mouth 2 (two) times daily.      . Cholecalciferol (VITAMIN D) 1000 UNITS capsule Take 1,000 Units by mouth daily.      . Cyanocobalamin (B-12) 100 MCG TABS Take 1 tablet by mouth daily.    . famotidine (PEPCID) 10 MG tablet Take 10 mg by mouth daily.      Marland Kitchen glucosamine-chondroitin 500-400 MG tablet  Take 2 tablets by mouth daily.    . methocarbamol (ROBAXIN) 500 MG tablet Take 1-2 tablets (500-1,000 mg total) by mouth every 6 (six) hours as needed for muscle spasms. 30 tablet 0  . Multiple Vitamins-Minerals (MULTIVITAMIN WITH MINERALS) tablet Take 1 tablet by mouth daily.      . Omega-3 Fatty Acids (FISH OIL) 1000 MG CAPS Take 1 capsule by mouth 2 (two) times daily.     . vitamin C (ASCORBIC ACID) 500 MG tablet Take 500 mg by mouth 2 (two) times daily.       No current facility-administered medications on file prior to visit.   Allergies  Allergen Reactions  . Lanolin Rash  . Latex Rash   ROS:  No fever, chills, URI symptoms, shortness of breath, chest pain, GI or GU complaints, bleeding, bruising, rash.  +radiculopathy and paresthesias as per HPI  PHYSICAL EXAM: BP 120/66 mmHg  Pulse 60  Ht 5\' 7"  (1.702 m)  Wt 145 lb 6.4 oz (65.953 kg)  BMI 22.77 kg/m2  LMP 11/25/1997 Well developed, pleasant female, in no distress, in good spirits Back: Spine and SI joints are nontender.  When doing pyriformis stretch, she gets a pulling sensation at her left SI joint.  FROM at the hip. +SLR on  the left--causes pain to shoot up to her SI joint/lower back (not shoot DOWN) DTR's are 2+ and symmetric at the knee, and equally diminished at the ankles Normal strength, sensation.  ASSESSMENT/PLAN:  Lumbar radiculopathy - Plan: methylPREDNISolone (MEDROL DOSEPAK) 4 MG TBPK tablet, AMB referral to rehabilitation   Lumbar radiculopathy on the left.  May have some SI component. No evidence of weakness or neurologic findings on exam. Patient would benefit from PT--referral done.  May need MRI if not improving.  Needs short-term relief to be able to golf on her birthday. Therefore, will go with a steroid course, rather than NSAIDs.  Risks/side effects of steroids reviewed.   Take the steroid pack as directed.  Do not use ibuprofen or other NSAID's along with it. You may use tylenol along with  it. If/when your discomfort returns, rather than refilling and continuing on steroids, we should change to a prescription anti-inflammatory (ie naproxen twice daily or mobic once daily)  We are referring you to physical therapy.  If you develop any weakness, bowel or bladder problems, please let us know immediately.  If you aren't improving with physical therapy, then we will need to get an MRI.

## 2014-10-18 ENCOUNTER — Telehealth: Payer: Self-pay | Admitting: Family Medicine

## 2014-10-18 NOTE — Telephone Encounter (Signed)
Ensure that she was scheduled for PT.  She was given a dosepak when she was here--did she complete the course? (usually is 6 days).  Cannot start NSAIDs until the day following the last day of steroids.  Okay to rx meloxicam 15mg  once daily with food #30, no refill.

## 2014-10-18 NOTE — Telephone Encounter (Signed)
Pt called and stated she is still having a lot of issues with pain. She states she would like to take you up on your offer of a rx for a anti-inflammatory. Pt uses [facility] and can be reached at [phone].

## 2014-10-19 MED ORDER — MELOXICAM 15 MG PO TABS: 15.0000 mg | ORAL_TABLET | Freq: Every day | ORAL | Status: DC

## 2014-10-19 NOTE — Telephone Encounter (Signed)
Spoke with patient and she finished steroid pack Monday. PT called her yesterday-she will call them back today to schedule. Rx cent to [facility].

## 2014-10-24 ENCOUNTER — Ambulatory Visit: Payer: BLUE CROSS/BLUE SHIELD | Attending: Family Medicine

## 2014-10-24 DIAGNOSIS — M2560 Stiffness of unspecified joint, not elsewhere classified: Secondary | ICD-10-CM | POA: Diagnosis not present

## 2014-10-24 DIAGNOSIS — M545 Low back pain: Secondary | ICD-10-CM | POA: Diagnosis not present

## 2014-10-24 DIAGNOSIS — R293 Abnormal posture: Secondary | ICD-10-CM | POA: Insufficient documentation

## 2014-10-24 NOTE — Therapy (Signed)
[place], [place], [address] Phone: [phone]   Fax:  [phone]  Physical Therapy Evaluation  Patient Details  Name: Shannon [name] MRN: [id number] Date of Birth: [birth year] Referring Provider:  Rita Ohara, MD  Encounter Date: 10/24/2014      PT End of Session - 10/24/14 0903    Visit Number 1   Number of Visits 8   Date for PT Re-Evaluation 11/21/14   PT Start Time 0803   PT Stop Time 0848   PT Time Calculation (min) 45 min   Activity Tolerance Patient tolerated treatment well   Behavior During Therapy [facility] for tasks assessed/performed      Past Medical History  Diagnosis Date  . IBS (irritable bowel syndrome)   . Benign hematuria     negative workup in past (in [place])  . Cerebral AVM     Dr. Christella Noa  . GERD (gastroesophageal reflux disease)     Past Surgical History  Procedure Laterality Date  . Brain surgery  03/2002    R posterior frontal AVM resection   . Spine surgery  2007    Anterior cervical decompression C5-C6 and C6-C7  . Orif distal radius fracture Left 11/05/11    Dr. Mardelle Matte    There were no vitals filed for this visit.  Visit Diagnosis:  Left low back pain, with sciatica presence unspecified - Plan: PT plan of care cert/re-cert  Joint stiffness of spine - Plan: PT plan of care cert/re-cert  Abnormal posture - Plan: PT plan of care cert/re-cert      Subjective Assessment - 10/24/14 0812    Subjective She reports LBP and pain into Lt   leg  in past 3 weeks . She was travelling during the time of onset in December. .  She is better with steroids, TENS  exercise ( Yoga poses) and stretches   Pertinent History She reports LBP in past with last 9 years ago remidied with injections.  Saw chiropractor since 06/2014. and is seeing currently. Chiro treats with manipulation, No modalities.    Limitations Walking  walks less than normal, bending to put dog dishes on floor   How  long can you sit comfortably? 30 min   How long can you stand comfortably? 15 min   How long can you walk comfortably? 15 min   Diagnostic tests None   Patient Stated Goals to learn management of situation and trend with less pain   Currently in Pain? Yes   Pain Score 2    Pain Location Back  and Lt buttock   Pain Orientation Left   Pain Descriptors / Indicators Aching;Tingling  tingle in toes 1-2   Pain Type Chronic pain;Acute pain   Pain Onset More than a month ago  le pain in past months   Pain Frequency Constant   Aggravating Factors  bending, sitting ,standing ,walking   Pain Relieving Factors Medication, lying, sit with support, TENS   Effect of Pain on Daily Activities see above   Multiple Pain Sites No            [facility] PT Assessment - 10/24/14 0801    Assessment   Medical Diagnosis Lumbar radiculopathy   Onset Date --  05/2014   Prior Therapy chiropractics   Precautions   Precautions None   Restrictions   Weight Bearing Restrictions No   Balance Screen   Has the patient fallen in the past 6 months No   Prior  Function   Level of Independence Independent with basic ADLs   Cognition   Overall Cognitive Status Within Functional Limits for tasks assessed   Observation/Other Assessments   Focus on Therapeutic Outcomes (FOTO)  40%   Posture/Postural Control   Posture Comments mild sway back   ROM / Strength   AROM / PROM / Strength AROM;Strength   AROM   Overall AROM Comments Hip range normal   AROM Assessment Site Lumbar  pulling feeling down LT leg , LT knee bent   Lumbar Flexion She can tpouch her proximal tibias  pullig feeling down LT leg   Lumbar Extension WNL   Lumbar - Right Side Bend 25   Lumbar - Left Side Bend 25   Strength   Overall Strength Comments Normal LE RT and LT   Flexibility   Soft Tissue Assessment /Muscle Length yes   Hamstrings RT 60 degrees , LT 35 degrees with pain.    Palpation   Palpation Posterior LT ilia, No tnederness  along gluteals and S! atrea, no tnederness in L4-5 area and para spinals   Ambulation/Gait   Gait Comments Normal                   [facility] Adult PT Treatment/Exercise - 10/24/14 0801    Manual Therapy   Manual Therapy Joint mobilization;Myofascial release;Manual Traction;Muscle Energy Technique   Manual Traction with pull to Lt LE she reported no pain.    Muscle Energy Technique Hip flexion while hip extensded in RT sidelye and PA mobs to ilia  Stopped when she reported foot tingle incr.                 PT Education - 10/24/14 0856    Education provided Yes   Education Details POC   Person(s) Educated Patient   Methods Explanation   Comprehension Verbalized understanding             PT Long Term Goals - 10/24/14 6948    PT LONG TERM GOAL #1   Title She will be independent with all HEP issude as of last visit   Time 4   Period Weeks   Status New   PT LONG TERM GOAL #2   Title she will report leg symptoms decreased  50% or mroe with normal activity   Time 4   Period Weeks   Status New   PT LONG TERM GOAL #3   Title she will report being able to sit for 45-60 min  without increased pain.    Time 4   Period Weeks   Status New   PT LONG TERM GOAL #4   Title She will be able to place bowls on floor with min pain and good mechanics.    Time 4   Period Weeks   Status New               Plan - 10/24/14 5462    Clinical Impression Statement Pain in back and leg with parasthesias into LT foot. She is doing good management techniques and is doing exerivces as we would issue. She may benefit from more stabilization exerics. We will try traction as this seems to ease symptoms in leg.    Pt will benefit from skilled therapeutic intervention in order to improve on the following deficits Pain;Improper body mechanics;Postural dysfunction;Decreased activity tolerance   Rehab Potential Fair   PT Frequency 2x / week   PT Duration 4 weeks   PT  Treatment/Interventions ADLs/Self  Care Home Management;Therapeutic exercise;Patient/family education;Manual techniques;Traction   PT Next Visit Plan Trial of traction and , stabilization Pilates based   PT Home Exercise Plan Stabilization   Consulted and Agree with Plan of Care Patient         Problem List Patient Active Problem List   Diagnosis Date Noted  . Postmenopausal 03/15/2013    Darrel Hoover PT 10/24/2014, 9:26 AM  [facility] [address] [place], [place], [address] Phone: [phone]   Fax:  [phone]

## 2014-11-02 ENCOUNTER — Ambulatory Visit: Payer: BLUE CROSS/BLUE SHIELD | Admitting: Physical Therapy

## 2014-11-02 DIAGNOSIS — M545 Low back pain: Secondary | ICD-10-CM

## 2014-11-02 DIAGNOSIS — M2560 Stiffness of unspecified joint, not elsewhere classified: Secondary | ICD-10-CM

## 2014-11-02 DIAGNOSIS — R293 Abnormal posture: Secondary | ICD-10-CM

## 2014-11-02 NOTE — Therapy (Signed)
[place], [place], [address] Phone: [phone]   Fax:  [phone]  Physical Therapy Treatment  Patient Details  Name: ONITA PFLUGER MRN: [id number] Date of Birth: [birth year] Referring Provider:  Rita Ohara, MD  Encounter Date: 11/02/2014      PT End of Session - 11/02/14 1801    Visit Number 2   Number of Visits 8   Date for PT Re-Evaluation 11/21/14   PT Start Time 0845   PT Stop Time 0940   PT Time Calculation (min) 55 min   Activity Tolerance Patient tolerated treatment well;Patient limited by pain   Behavior During Therapy [facility] for tasks assessed/performed      Past Medical History  Diagnosis Date  . IBS (irritable bowel syndrome)   . Benign hematuria     negative workup in past (in [place])  . Cerebral AVM     Dr. Christella Noa  . GERD (gastroesophageal reflux disease)     Past Surgical History  Procedure Laterality Date  . Brain surgery  03/2002    R posterior frontal AVM resection   . Spine surgery  2007    Anterior cervical decompression C5-C6 and C6-C7  . Orif distal radius fracture Left 11/05/11    Dr. Mardelle Matte    There were no vitals filed for this visit.  Visit Diagnosis:  Left low back pain, with sciatica presence unspecified  Joint stiffness of spine  Abnormal posture      Subjective Assessment - 11/02/14 0856    Subjective (p) doing her home exercises.  does her exercises.  Sometimes  paing goes into her thigh.    Currently in Pain? (p) Yes   Pain Score (p) 4    Pain Location (p) Back   Pain Orientation (p) Left   Pain Descriptors / Indicators (p) Aching;Numbness                         [facility] Adult PT Treatment/Exercise - 11/02/14 0850    Lumbar Exercises: Stretches   Passive Hamstring Stretch 3 reps;30 seconds  6/10 pain increased from 4/10 and radiated to thigh   Lumbar Exercises: Prone   Other Prone Lumbar Exercises prone on elbows to centralize low  back pain   Other Prone Lumbar Exercises multifitus presses only 7 reps 5 second holds   Traction   Type of Traction Lumbar   Min (lbs) 30   Max (lbs) 70   Hold Time Protocol for disc imvolvement with muscle guarding   Time 15                     PT Long Term Goals - 10/24/14 8841    PT LONG TERM GOAL #1   Title She will be independent with all HEP issude as of last visit   Time 4   Period Weeks   Status New   PT LONG TERM GOAL #2   Title she will report leg symptoms decreased  50% or mroe with normal activity   Time 4   Period Weeks   Status New   PT LONG TERM GOAL #3   Title she will report being able to sit for 45-60 min  without increased pain.    Time 4   Period Weeks   Status New   PT LONG TERM GOAL #4   Title She will be able to place bowls on floor with min pain and good mechanics.  Time 4   Period Weeks   Status New               Plan - 11/02/14 1802    Clinical Impression Statement Trial traction, prone exercises .Marland KitchenNo new goals met   PT Next Visit Plan assess traction and , stabilization Pilates based   Consulted and Agree with Plan of Care Patient        Problem List Patient Active Problem List   Diagnosis Date Noted  . Postmenopausal 03/15/2013    HARRIS,KAREN 11/02/2014, 6:05 PM  [facility] [address] [place], [place], [address] Phone: [phone]   Fax:  [phone]  Melvenia Needles, PTA 11/02/2014 6:05 PM Phone: [phone] Fax: [phone]

## 2014-11-09 ENCOUNTER — Ambulatory Visit: Payer: BLUE CROSS/BLUE SHIELD | Attending: Family Medicine | Admitting: Physical Therapy

## 2014-11-09 DIAGNOSIS — R293 Abnormal posture: Secondary | ICD-10-CM | POA: Diagnosis present

## 2014-11-09 DIAGNOSIS — M545 Low back pain: Secondary | ICD-10-CM | POA: Insufficient documentation

## 2014-11-09 DIAGNOSIS — M2560 Stiffness of unspecified joint, not elsewhere classified: Secondary | ICD-10-CM | POA: Diagnosis present

## 2014-11-09 NOTE — Therapy (Addendum)
[place], [place], [address] Phone: [phone]   Fax:  [phone]  Physical Therapy Treatment  Patient Details  Name: Shannon [name] MRN: [id number] Date of Birth: 1961-01-23 Referring Provider:  Denita Lung, MD  Encounter Date: 11/09/2014      PT End of Session - 11/09/14 0858    Visit Number 3   Number of Visits 8   Date for PT Re-Evaluation 11/21/14   Authorization Type [employer]   Authorization Time Period None   PT Start Time 0850   PT Stop Time 0945   PT Time Calculation (min) 55 min      Past Medical History  Diagnosis Date  . IBS (irritable bowel syndrome)   . Benign hematuria     negative workup in past (in [place])  . Cerebral AVM     Dr. Christella Noa  . GERD (gastroesophageal reflux disease)     Past Surgical History  Procedure Laterality Date  . Brain surgery  03/2002    R posterior frontal AVM resection   . Spine surgery  2007    Anterior cervical decompression C5-C6 and C6-C7  . Orif distal radius fracture Left 11/05/11    Dr. Mardelle Matte    There were no vitals filed for this visit.  Visit Diagnosis:  Left low back pain, with sciatica presence unspecified  Abnormal posture  Joint stiffness of spine      Subjective Assessment - 11/09/14 0910    Subjective The exercises on my stomach really aggravate my leg pain.    Currently in Pain? Yes   Pain Score 3   2 or 3    Pain Location Back  Lt buttock   Pain Descriptors / Indicators Aching;Stabbing;Tingling;Sharp   Pain Frequency Constant   Aggravating Factors  sitting in my chair at work    Pain Relieving Factors Tens, lying down, antiinflammatory                         [facility] Adult PT Treatment/Exercise - 11/09/14 0914    Lumbar Exercises: Supine   Ab Set 5 reps   AB Set Limitations Tra recruitment training   Clam 20 reps   Clam Limitations unilateral   Heel Slides 20 reps   Heel Slides Limitations cues  for breathing   Bent Knee Raise 20 reps   Bent Knee Raise Limitations cues for breathing   Straight Leg Raise 10 reps   Straight Leg Raises Limitations pain in left SI with left SLR   Other Supine Lumbar Exercises SI stab: ball squeeze x 10, isometric clam x 10   Manual Therapy   Muscle Energy Technique supine left hip flexion with right hip extension isometric press 5 sec x 10 for posterior left ilia  pt level afterwards                     PT Long Term Goals - 11/09/14 1037    PT LONG TERM GOAL #1   Title She will be independent with all HEP issude as of last visit   Time 4   Period Weeks   Status On-going   PT LONG TERM GOAL #2   Title she will report leg symptoms decreased  50% or mroe with normal activity   Time 4   Period Weeks   Status On-going   PT LONG TERM GOAL #3   Title she will report being able to sit  for 45-60 min  without increased pain.    Time 4   Period Weeks   Status On-going   PT LONG TERM GOAL #4   Title She will be able to place bowls on floor with min pain and good mechanics.    Time 4   Period Weeks   Status On-going               Plan - 11/09/14 1027    Clinical Impression Statement Pt reports increased leg pain after prone extension biased exercises last visit. She did feel  better after traction for several days until she resumed work activites on Monday including sitting. Pt instructed in MET today for left posterior ilia as well as stabilization exercises. After MET and SI/core stab  pt reports no pain and no N/T. Pt given SI stab, prepilates stab, pilates community class info and MET instructions  for HEP. Pt is paying out of pocket for PT and would like to HOLD for a couple of weeks to try MET and stabilization exercises. She will call us to discharge or to resume in 2 weeks. No traction today due to resolution of symptoms after MET and stab.    PT Next Visit Plan Hold for 2 weeks. resume traction if beneficial.         Problem List Patient Active Problem List   Diagnosis Date Noted  . Postmenopausal 03/15/2013    Dorene Ar, PTA 11/09/2014, 10:57 AM  [facility] [address] [place], [place], [address] Phone: [phone]   Fax:  [phone]     PHYSICAL THERAPY DISCHARGE SUMMARY  Visits from Start of Care: 3  Current functional level related to goals / functional outcomes: Unknown   Remaining deficits: Unknown   Education / Equipment: HEP  Plan:                                                    Patient goals were not met. Patient is being discharged due to not returning since the last visit.  ?????   Lillette Boxer Chasse PT            05/03/15        9:20 AM

## 2014-11-14 ENCOUNTER — Ambulatory Visit: Payer: BLUE CROSS/BLUE SHIELD

## 2014-11-25 ENCOUNTER — Telehealth: Payer: Self-pay | Admitting: Family Medicine

## 2014-11-25 DIAGNOSIS — M545 Low back pain: Secondary | ICD-10-CM

## 2014-11-25 MED ORDER — METHOCARBAMOL 500 MG PO TABS: 500.0000 mg | ORAL_TABLET | Freq: Four times a day (QID) | ORAL | Status: DC | PRN

## 2014-11-25 MED ORDER — MELOXICAM 15 MG PO TABS: 15.0000 mg | ORAL_TABLET | Freq: Every day | ORAL | Status: DC

## 2014-11-25 NOTE — Telephone Encounter (Signed)
Requesting refill on Methocarbamol 500mg  and Meloxicam 15mg 

## 2014-11-25 NOTE — Telephone Encounter (Signed)
Called pt to inform that refills are at her pharmacy

## 2014-11-25 NOTE — Telephone Encounter (Signed)
done

## 2015-01-10 ENCOUNTER — Other Ambulatory Visit: Payer: Self-pay | Admitting: Family Medicine

## 2015-01-10 DIAGNOSIS — M545 Low back pain: Secondary | ICD-10-CM

## 2015-01-11 MED ORDER — METHOCARBAMOL 500 MG PO TABS: 500.0000 mg | ORAL_TABLET | Freq: Four times a day (QID) | ORAL | Status: DC | PRN

## 2015-01-11 MED ORDER — MELOXICAM 15 MG PO TABS: 15.0000 mg | ORAL_TABLET | Freq: Every day | ORAL | Status: DC

## 2015-02-15 ENCOUNTER — Other Ambulatory Visit: Payer: Self-pay | Admitting: Medical

## 2015-02-16 ENCOUNTER — Telehealth: Payer: Self-pay | Admitting: *Deleted

## 2015-02-16 DIAGNOSIS — M545 Low back pain: Secondary | ICD-10-CM

## 2015-02-16 MED ORDER — METHOCARBAMOL 500 MG PO TABS: 500.0000 mg | ORAL_TABLET | Freq: Four times a day (QID) | ORAL | Status: DC | PRN

## 2015-02-16 MED ORDER — MELOXICAM 15 MG PO TABS: 15.0000 mg | ORAL_TABLET | Freq: Every day | ORAL | Status: DC

## 2015-02-16 NOTE — Telephone Encounter (Signed)
sent 

## 2015-03-29 ENCOUNTER — Encounter: Payer: BLUE CROSS/BLUE SHIELD | Admitting: Family Medicine

## 2015-05-11 ENCOUNTER — Encounter: Payer: Self-pay | Admitting: Family Medicine

## 2015-05-11 ENCOUNTER — Ambulatory Visit (INDEPENDENT_AMBULATORY_CARE_PROVIDER_SITE_OTHER): Payer: Managed Care, Other (non HMO) | Admitting: Family Medicine

## 2015-05-11 VITALS — BP 118/72 | HR 64 | Ht 67.5 in | Wt 149.0 lb

## 2015-05-11 DIAGNOSIS — Z5181 Encounter for therapeutic drug level monitoring: Secondary | ICD-10-CM

## 2015-05-11 DIAGNOSIS — Z0000 Encounter for general adult medical examination without abnormal findings: Secondary | ICD-10-CM | POA: Diagnosis not present

## 2015-05-11 DIAGNOSIS — Z1159 Encounter for screening for other viral diseases: Secondary | ICD-10-CM | POA: Diagnosis not present

## 2015-05-11 DIAGNOSIS — M545 Low back pain, unspecified: Secondary | ICD-10-CM | POA: Insufficient documentation

## 2015-05-11 DIAGNOSIS — E7800 Pure hypercholesterolemia, unspecified: Secondary | ICD-10-CM | POA: Diagnosis not present

## 2015-05-11 LAB — COMPREHENSIVE METABOLIC PANEL
ALT: 26 U/L (ref 6–29)
AST: 20 U/L (ref 10–35)
Albumin: 4.4 g/dL (ref 3.6–5.1)
Alkaline Phosphatase: 47 U/L (ref 33–130)
BUN: 10 mg/dL (ref 7–25)
CHLORIDE: 98 mmol/L (ref 98–110)
CO2: 27 mmol/L (ref 20–31)
Calcium: 9.5 mg/dL (ref 8.6–10.4)
Creat: 0.54 mg/dL (ref 0.50–1.05)
GLUCOSE: 87 mg/dL (ref 65–99)
POTASSIUM: 4.3 mmol/L (ref 3.5–5.3)
Sodium: 136 mmol/L (ref 135–146)
Total Bilirubin: 0.8 mg/dL (ref 0.2–1.2)
Total Protein: 6.9 g/dL (ref 6.1–8.1)

## 2015-05-11 LAB — CBC WITH DIFFERENTIAL/PLATELET
BASOS ABS: 0 10*3/uL (ref 0.0–0.1)
Basophils Relative: 1 % (ref 0–1)
EOS ABS: 0 10*3/uL (ref 0.0–0.7)
EOS PCT: 1 % (ref 0–5)
HCT: 37.8 % (ref 36.0–46.0)
Hemoglobin: 13.1 g/dL (ref 12.0–15.0)
LYMPHS ABS: 1.3 10*3/uL (ref 0.7–4.0)
LYMPHS PCT: 28 % (ref 12–46)
MCH: 33.3 pg (ref 26.0–34.0)
MCHC: 34.7 g/dL (ref 30.0–36.0)
MCV: 96.2 fL (ref 78.0–100.0)
MONOS PCT: 11 % (ref 3–12)
MPV: 9 fL (ref 8.6–12.4)
Monocytes Absolute: 0.5 10*3/uL (ref 0.1–1.0)
NEUTROS PCT: 59 % (ref 43–77)
Neutro Abs: 2.8 10*3/uL (ref 1.7–7.7)
PLATELETS: 262 10*3/uL (ref 150–400)
RBC: 3.93 MIL/uL (ref 3.87–5.11)
RDW: 13 % (ref 11.5–15.5)
WBC: 4.7 10*3/uL (ref 4.0–10.5)

## 2015-05-11 LAB — LIPID PANEL
CHOL/HDL RATIO: 1.6 ratio (ref <=5.0)
Cholesterol: 226 mg/dL — ABNORMAL HIGH (ref 125–200)
HDL: 138 mg/dL (ref >=46)
LDL Cholesterol: 82 mg/dL (ref <130)
Triglycerides: 31 mg/dL (ref <150)
VLDL: 6 mg/dL (ref <30)

## 2015-05-11 LAB — POCT URINALYSIS DIPSTICK
Bilirubin, UA: NEGATIVE
GLUCOSE UA: NEGATIVE
Ketones, UA: NEGATIVE
Leukocytes, UA: NEGATIVE
NITRITE UA: NEGATIVE
Protein, UA: NEGATIVE
SPEC GRAV UA: 1.01
Urobilinogen, UA: NEGATIVE
pH, UA: 6.5

## 2015-05-11 MED ORDER — MELOXICAM 15 MG PO TABS: 15.0000 mg | ORAL_TABLET | Freq: Every day | ORAL | Status: DC

## 2015-05-11 NOTE — Progress Notes (Signed)
Chief Complaint  Patient presents with  . Annual Exam    fasting annual exam with pelvic(last pap 2014). UA showed 1+ blood, she states that she always has that. Back is doing better, not 100%, but better.    Shannon [name] is a 54 y.o. female who presents for a complete physical.  She has some ongoing back pain. Meloxicam and robaxin were last refilled in September (90d supply of meloxicam, #60 of robaxin). She takes the meloxicam just about every day, uses the muscle relaxant (sometimes just 1/2 tablet) a few times/week, if needed.  She had PT in May/June x 3 sessions, after being evaluated in early May here, at which time she was treated with a steroid course for lumbar radiculopathy. The therapists were treating left SI joint, which is much better.  Now having some pain on the right side, higher than the SI joint.  No longer having any radiculopathy, sometimes discomfort just goes into the left buttock.  Continues to see the chiropractor weekly.   Immunization History  Administered Date(s) Administered  . Influenza Split 03/08/2014  . Influenza,inj,Quad PF,36+ Mos 03/15/2013  . Influenza-Unspecified 04/04/2015  . Td 02/21/2005  . Tdap 03/31/2014   Last Pap smear: 03/2013--normal, with no high risk HPV  Last mammogram: 03/2014, scheduled for later this month Last colonoscopy: 04/20/07 (internal hemorrhoids, sessile polyp--no path report in chart, but pt states told repeat 10 yrs)-Dr. Collene Mares  Last DEXA: 03/19/07, normal  Dentist: every 6 months  Ophtho: every other year, went 03/2014 Exercise: walks twice daily at work, on her breaks (20 min breaks, gets in a mile each walk). Has treadmill at home which she does if she can't walk at work. Hasn't been doing any weight-bearing exercise since her back flared. Hasn't had a period in over 15 years--perhaps just a couple of periods after coming off of Depo Provera (around age 39, after high [place] noted in 2011)  Past Medical History   Diagnosis Date  . IBS (irritable bowel syndrome)   . Benign hematuria     negative workup in past (in [place])  . Cerebral AVM     Dr. Christella Noa  . GERD (gastroesophageal reflux disease)     Past Surgical History  Procedure Laterality Date  . Brain surgery  03/2002    R posterior frontal AVM resection   . Spine surgery  2007    Anterior cervical decompression C5-C6 and C6-C7  . Orif distal radius fracture Left 11/05/11    Dr. Mardelle Matte    Social History   Social History  . Marital Status: Single    Spouse Name: N/A  . Number of Children: N/A  . Years of Education: N/A   Occupational History  . customer service [employer])    Social History Main Topics  . Smoking status: Former Smoker    Quit date: 10/08/2004  . Smokeless tobacco: Never Used  . Alcohol Use: Yes     Comment: 2 drinks per day (bourbon and water--1 shot of bourbon)  . Drug Use: No  . Sexual Activity:    Partners: Female   Other Topics Concern  . Not on file   Social History Narrative   Lives with 2 dogs    Family History  Problem Relation Age of Onset  . Hypertension Father   . Diabetes Father   . Heart disease Maternal Grandmother   . Hypertension Mother   . Transient ischemic attack Mother   . Thyroid disease Mother   . Diabetes Sister   .  Thyroid disease Sister   . Seizures Brother     childhood; resolved  . Cancer Paternal Aunt     breast  . Breast cancer Paternal Aunt     45's  . Prostate cancer Cousin   . Cancer Cousin   . Cancer Cousin     brain    Outpatient Encounter Prescriptions as of 05/11/2015  Medication Sig Note  . aspirin 81 MG tablet Take 81 mg by mouth daily.     Marland Kitchen CALCIUM CITRATE PO Take 1 tablet by mouth 2 (two) times daily.     . Cholecalciferol (VITAMIN D) 1000 UNITS capsule Take 1,000 Units by mouth daily.     . Cyanocobalamin (B-12) 100 MCG TABS Take 1 tablet by mouth daily.   . famotidine (PEPCID) 10 MG tablet Take 10 mg by mouth daily.     Marland Kitchen glucosamine-chondroitin  500-400 MG tablet Take 2 tablets by mouth daily.   . meloxicam (MOBIC) 15 MG tablet Take 1 tablet (15 mg total) by mouth daily.   . methocarbamol (ROBAXIN) 500 MG tablet Take 1-2 tablets (500-1,000 mg total) by mouth every 6 (six) hours as needed for muscle spasms. 05/11/2015: Uses 1/2-1 tablet as needed, up to a few times/week  . Multiple Vitamins-Minerals (MULTIVITAMIN WITH MINERALS) tablet Take 1 tablet by mouth daily.     . Omega-3 Fatty Acids (FISH OIL) 1000 MG CAPS Take 1 capsule by mouth 2 (two) times daily.    . vitamin C (ASCORBIC ACID) 500 MG tablet Take 500 mg by mouth 2 (two) times daily.     . [DISCONTINUED] ibuprofen (ADVIL,MOTRIN) 200 MG tablet Take 400 mg by mouth 3 (three) times daily.    No facility-administered encounter medications on file as of 05/11/2015.    Allergies  Allergen Reactions  . Lanolin Rash  . Latex Rash   ROS: The patient denies anorexia, fever, weight changes, headaches, vision changes, decreased hearing, ear pain, sore throat, breast concerns, chest pain, palpitations, dizziness, syncope, dyspnea on exertion, cough, swelling, nausea, vomiting, diarrhea, constipation, abdominal pain, melena, hematochezia, incontinence, dysuria, postmenopausal bleeding, vaginal discharge, odor or itch, genital lesions, weakness, tremor, suspicious skin lesions, depression, anxiety, abnormal bleeding/bruising, or enlarged lymph nodes. Hemorrhoids occasionally flare.  +Residual numbness L hand/arm (since brain surgery). This remains unchanged. Denies any weakness, but her motor skills aren't as good in that hand (noticed in her typing).  Reflux if doesn't take Pepcid (likes spicy food). Denies any abdominal pain or dysphagia. H/o microscopic hematuria--no visible hematuria or other urinary complaints. Denies hot flashes, night sweats. Occasional foot/ankle cramps at night Mild congestion in her ears/sinuses. Stools are unchanged--loose in the mornings. Back pain per  HPI. +allergies over the last few weeks. Using sinus rinses daily. Denies any hemorrhoidal bleeding.  PHYSICAL EXAM:  BP 118/72 mmHg  Pulse 64  Ht 5' 7.5" (1.715 m)  Wt 149 lb (67.586 kg)  BMI 22.98 kg/m2  LMP 11/25/1997  General Appearance:  Alert, cooperative, no distress, appears stated age   Head:  Normocephalic, without obvious abnormality, atraumatic   Eyes:  PERRL, conjunctiva/corneas clear, EOM's intact, fundi  benign   Ears:  Normal TM's and external ear canals   Nose:  Nares normal, mucosa normal, no drainage or sinus tenderness   Throat:  Lips, mucosa, and tongue normal; teeth and gums normal.   Neck:  Supple, normal ROM; thyroid: no enlargement/tenderness/nodules; no carotid  bruit or JVD.   Back:  Spine nontender, no curvature, no CVA tenderness. No SI  joint tenderness. No muscle spasm. Area of discomfort is her right lumbar paraspinous muscles  Lungs:  Clear to auscultation bilaterally without wheezes, rales or ronchi; respirations unlabored   Chest Wall:  No tenderness or deformity   Heart:  Regular rate and rhythm, S1 and S2 normal, no murmur, rub  or gallop   Breast Exam:  No tenderness, masses, or nipple discharge or inversion. No axillary lymphadenopathy  Abdomen:  Soft, non-tender, nondistended, normoactive bowel sounds,  no masses, no hepatosplenomegaly   Genitalia:  Normal external genitalia without lesions. BUS and vagina normal; no cervical motion tenderness. Uterus and adnexa not enlarged, nontender, no masses. Pap not performed   Rectal:  Normal tone, no masses or tenderness; guaiac negative stool   Extremities:  No clubbing, cyanosis or edema.   Pulses:  2+ and symmetric all extremities   Skin:  Skin color, texture, turgor normal, no rashes or lesions. Many tattoos   Lymph nodes:  Cervical, supraclavicular, and axillary nodes normal.   Neurologic:  CNII-XII intact, normal strength, sensation and gait; reflexes  2+, except 3+ in RUE.  Psych: Normal mood, affect, hygiene and grooming       ASSESSMENT/PLAN:  Annual physical exam - Plan: POCT Urinalysis Dipstick, Visual acuity screening, Lipid panel, Comprehensive metabolic panel, CBC with Differential/Platelet, TSH, Hepatitis C antibody  Medication monitoring encounter - Plan: Comprehensive metabolic panel, CBC with Differential/Platelet  Pure hypercholesterolemia - Plan: Lipid panel  Need for hepatitis C screening test - Plan: Hepatitis C antibody  Low back pain without sciatica, unspecified back pain laterality - risks of longterm NSAIDs reviewed; encouraged core strengthening, hamstring stretches. f/u with Dr. Cyndy Freeze if recurrent radiculopathy - Plan: meloxicam (MOBIC) 15 MG tablet   TSH, c-met, Hep C Ab, CBC, lipids  Discussed monthly self breast exams and yearly mammograms; pap not performed--due again in 2017 or 2019; at least 30 minutes of aerobic activity at least 5 days/week, weight bearing exercise 2xwk; proper sunscreen use reviewed; healthy diet, including goals of calcium and vitamin D intake and alcohol recommendations (less than or equal to 1 drink/day) reviewed; regular seatbelt use; changing batteries in smoke detectors. Immunization recommendations discussed-- continue flu shots annually. Colonoscopy recommendations reviewed--UTD   Refill meloxicam x 6 months Risks/side effects reviewed. Discussed needing c-met in 6 months, if still taking daily.

## 2015-05-11 NOTE — Patient Instructions (Signed)

## 2015-05-12 LAB — HEPATITIS C ANTIBODY: HCV AB: NEGATIVE

## 2015-05-12 LAB — TSH: TSH: 1.16 u[IU]/mL (ref 0.350–4.500)

## 2015-05-25 LAB — HM MAMMOGRAPHY: HM Mammogram: NORMAL

## 2015-05-29 ENCOUNTER — Encounter: Payer: Self-pay | Admitting: *Deleted

## 2015-12-15 ENCOUNTER — Other Ambulatory Visit: Payer: Self-pay | Admitting: Family Medicine

## 2015-12-15 DIAGNOSIS — Z5181 Encounter for therapeutic drug level monitoring: Secondary | ICD-10-CM

## 2015-12-15 NOTE — Telephone Encounter (Signed)
Is this ok to refill? Does pt need a lab visit for CMET?

## 2015-12-15 NOTE — Telephone Encounter (Signed)
It appears that she takes it most days (6 month supply lasted just over 7 months), so I recommend getting a c-met.  Okay to refill #90, but schedule for c-met and enter future order (dx med monitor). Thanks

## 2015-12-15 NOTE — Telephone Encounter (Signed)
Order is in and LM for pt to call back. Refilled for #90

## 2015-12-20 ENCOUNTER — Other Ambulatory Visit: Payer: Managed Care, Other (non HMO)

## 2015-12-20 DIAGNOSIS — Z5181 Encounter for therapeutic drug level monitoring: Secondary | ICD-10-CM

## 2015-12-20 LAB — COMPREHENSIVE METABOLIC PANEL
ALK PHOS: 45 U/L (ref 33–130)
ALT: 21 U/L (ref 6–29)
AST: 19 U/L (ref 10–35)
Albumin: 4.6 g/dL (ref 3.6–5.1)
BILIRUBIN TOTAL: 0.7 mg/dL (ref 0.2–1.2)
BUN: 7 mg/dL (ref 7–25)
CALCIUM: 9.3 mg/dL (ref 8.6–10.4)
CO2: 27 mmol/L (ref 20–31)
Chloride: 96 mmol/L — ABNORMAL LOW (ref 98–110)
Creat: 0.56 mg/dL (ref 0.50–1.05)
GLUCOSE: 73 mg/dL (ref 65–99)
Potassium: 4.2 mmol/L (ref 3.5–5.3)
Sodium: 133 mmol/L — ABNORMAL LOW (ref 135–146)
TOTAL PROTEIN: 6.5 g/dL (ref 6.1–8.1)

## 2016-03-29 ENCOUNTER — Other Ambulatory Visit (INDEPENDENT_AMBULATORY_CARE_PROVIDER_SITE_OTHER): Payer: Managed Care, Other (non HMO)

## 2016-03-29 DIAGNOSIS — Z23 Encounter for immunization: Secondary | ICD-10-CM | POA: Diagnosis not present

## 2016-04-25 ENCOUNTER — Other Ambulatory Visit: Payer: Self-pay | Admitting: Family Medicine

## 2016-04-26 MED ORDER — MELOXICAM 15 MG PO TABS: 15.0000 mg | ORAL_TABLET | Freq: Every day | ORAL | 0 refills | Status: DC | PRN

## 2016-05-11 NOTE — Progress Notes (Signed)
Chief Complaint  Patient presents with  . Annual Exam    fasting annual exam with pap. Has been having sinus issus x 2-3 weeks. Siniuses get really dry and bleed. Left ear feels like there may be some fluid.     Shannon [name] is a 55 y.o. female who presents for a complete physical.  She has the following concerns:  Some sinus congestion/allergies.  She is having dryness and bleeding from her left nostril. She has some discomfort/congestion/water sensation in the left ear. Not painful.  No fever, chills.  Mucus is mostly clear (rarely discolored).  She has some chronic back pain. Meloxicam was refilled a couple of weeks ago (#90), #60 of robaxin last filled 02/2015. She takes the meloxicam just about every day (went 2-3 weeks not needing it, but needing it more over the last few weeks, on her feet a lot at work); hasn't needed the muscle relaxant in about a year.   She had PT in May/June 2016 x 3 sessions, after being evaluated in early May here, at which time she was treated with a steroid course for lumbar radiculopathy. The therapists were treating left SI joint. Feels "jammed up" in the low back at times, sometimes in the SI joint.  No longer having any radiculopathy. Continues to see the chiropractor weekly.    Immunization History  Administered Date(s) Administered  . Influenza Split 03/08/2014  . Influenza,inj,Quad PF,36+ Mos 03/15/2013, 03/29/2016  . Influenza-Unspecified 04/04/2015  . Td 02/21/2005  . Tdap 03/31/2014   Last Pap smear: 03/2013--normal, with no high risk HPV  Last mammogram: 05/2015 Last colonoscopy: 04/20/07 (internal hemorrhoids, sessile polyp--no path report in chart, but pt states told repeat 10 yrs)-Dr. Collene Mares  Last DEXA: 03/19/07, normal  Dentist: every 6 months  Ophtho: every other year, went earlier this year Exercise: Walks (either at work or on her treadmill), more sporadically over the last few months, just 2 hours/week. Hasn't been doing any  weight-bearing exercise--just bodyweight once a week. Hasn't had a period in over 15 years--perhaps just a couple of periods after coming off of Depo Provera (around age 18, after high [place] noted in 2011) Lipids: Lab Results  Component Value Date   CHOL 226 (H) 05/11/2015   HDL 138 05/11/2015   LDLCALC 82 05/11/2015   TRIG 31 05/11/2015   CHOLHDL 1.6 05/11/2015   Past Medical History:  Diagnosis Date  . Benign hematuria    negative workup in past (in [place])  . Cerebral AVM    Dr. Christella Noa  . GERD (gastroesophageal reflux disease)   . IBS (irritable bowel syndrome)     Past Surgical History:  Procedure Laterality Date  . BRAIN SURGERY  03/2002   R posterior frontal AVM resection   . ORIF DISTAL RADIUS FRACTURE Left 11/05/11   Dr. Mardelle Matte  . SPINE SURGERY  2007   Anterior cervical decompression C5-C6 and C6-C7    Social History   Social History  . Marital status: Single    Spouse name: N/A  . Number of children: N/A  . Years of education: N/A   Occupational History  . 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 Smoking status: Former Smoker    Quit date: 10/08/2004  . Smokeless tobacco: Never Used  . Alcohol use Yes     Comment: 3 drinks per day (bourbon and water--1 shot of bourbon)  . Drug use: No  . Sexual activity: Not Currently    Partners: Female  Other Topics Concern  . Not on file   Social History Narrative   Lives with 3 dogs    Family History  Problem Relation Age of Onset  . Hypertension Father   . Diabetes Father   . Hypertension Mother   . Transient ischemic attack Mother   . Thyroid disease Mother   . Diabetes Sister   . Thyroid disease Sister   . Seizures Brother     childhood; resolved  . Heart disease Maternal Grandmother   . Cancer Paternal Aunt     breast  . Breast cancer Paternal Aunt     73's  . Prostate cancer Cousin   . Cancer Cousin   . Cancer Cousin     brain    Outpatient Encounter Prescriptions  as of 05/13/2016  Medication Sig Note  . aspirin 81 MG tablet Take 81 mg by mouth daily.     Marland Kitchen CALCIUM CITRATE PO Take 1 tablet by mouth 2 (two) times daily.     . Cholecalciferol (VITAMIN D) 1000 UNITS capsule Take 1,000 Units by mouth daily.     . Cyanocobalamin (B-12) 100 MCG TABS Take 1 tablet by mouth daily.   . famotidine (PEPCID) 10 MG tablet Take 10 mg by mouth daily.     Marland Kitchen glucosamine-chondroitin 500-400 MG tablet Take 2 tablets by mouth daily.   . meloxicam (MOBIC) 15 MG tablet Take 1 tablet (15 mg total) by mouth daily as needed for pain. 05/13/2016: Uses prn, needing most days recently  . Multiple Vitamins-Minerals (MULTIVITAMIN WITH MINERALS) tablet Take 1 tablet by mouth daily.     . Omega-3 Fatty Acids (FISH OIL) 1000 MG CAPS Take 1 capsule by mouth 2 (two) times daily.    . vitamin C (ASCORBIC ACID) 500 MG tablet Take 500 mg by mouth 2 (two) times daily.     . methocarbamol (ROBAXIN) 500 MG tablet Take 1-2 tablets (500-1,000 mg total) by mouth every 6 (six) hours as needed for muscle spasms. (Patient not taking: Reported on 05/13/2016) 05/13/2016: Hasn't needed in about a year   No facility-administered encounter medications on file as of 05/13/2016.     Allergies  Allergen Reactions  . Lanolin Rash  . Latex Rash    ROS: The patient denies anorexia, fever, weight changes, headaches, vision changes, decreased hearing, ear pain, sore throat, breast concerns, chest pain, palpitations, dizziness, syncope, dyspnea on exertion, cough, swelling, nausea, vomiting, diarrhea, constipation, abdominal pain, melena, hematochezia, incontinence, dysuria, postmenopausal bleeding, vaginal discharge, odor or itch, genital lesions, weakness, tremor, suspicious skin lesions, depression, anxiety, abnormal bleeding/bruising, or enlarged lymph nodes. +Residual numbness L hand/arm (since brain surgery). This remains unchanged. Denies any weakness, but her motor skills aren't as good in that hand (noticed  in her typing).  Reflux if doesn't take Pepcid (likes spicy food). Denies any abdominal pain or dysphagia.  H/o microscopic hematuria--no visible hematuria. Some very sporadic burning with voiding, not currently. Denies hot flashes, night sweats. Occasional foot/ankle cramps at night Stools are unchanged--loose in the mornings (after coffee). No hemorrhoidal bleeding Back pain per HPI, overall doing okay with meloxicam. +allergies over the last few weeks. Mild congestion in her ears/sinuses.    PHYSICAL EXAM:  BP 130/80 (BP Location: Right Arm, Patient Position: Sitting, Cuff Size: Normal)   Pulse 60   Temp 97.3 F (36.3 C) (Tympanic)   Ht _0  (1.727 m)   Wt 149 lb (67.6 kg)   LMP 11/25/1997   BMI 22.66 kg/m  General Appearance:  Alert, cooperative, no distress, appears stated age   Head:  Normocephalic, without obvious abnormality, atraumatic   Eyes:  PERRL, conjunctiva/corneas clear, EOM's intact, fundi  benign   Ears:  Normal TM's and external ear canals   Nose:  Nares normal, mucosa mod edematous, pale, no bleeding noted; no drainage or sinus tenderness   Throat:  Lips, mucosa, and tongue normal; teeth and gums normal.   Neck:  Supple, normal ROM; thyroid: no enlargement/tenderness/nodules; no carotid bruit or JVD.   Back:  Spine nontender, no curvature, no CVA tenderness. No SI joint tenderness. No muscle spasm.  Lungs:  Clear to auscultation bilaterally without wheezes, rales or ronchi; respirations unlabored   Chest Wall:  No tenderness or deformity   Heart:  Regular rate and rhythm, S1 and S2 normal, no murmur, rub or gallop   Breast Exam:  No tenderness, masses, or nipple discharge or inversion. No axillary lymphadenopathy  Abdomen:  Soft, non-tender, nondistended, normoactive bowel sounds, no masses, no hepatosplenomegaly   Genitalia:  Normal external genitalia without lesions. BUS and vagina normal; no cervical motion tenderness.  Uterus and adnexa not enlarged, nontender, no masses. Pap not performed   Rectal:  Normal tone, no masses or tenderness; guaiac negative stool   Extremities:  No clubbing, cyanosis or edema.   Pulses:  2+ and symmetric all extremities   Skin:  Skin color, texture, turgor normal, no rashes or lesions. Many tattoos   Lymph nodes:  Cervical, supraclavicular, and axillary nodes normal.   Neurologic:  CNII-XII intact, normal strength, sensation and gait; reflexes 2+ in lower extremities.  Psych: Normal mood, affect, hygiene and grooming    Chemistry      Component Value Date/Time   NA 133 (L) 12/20/2015 0803   K 4.2 12/20/2015 0803   CL 96 (L) 12/20/2015 0803   CO2 27 12/20/2015 0803   BUN 7 12/20/2015 0803   CREATININE 0.56 12/20/2015 0803      Component Value Date/Time   CALCIUM 9.3 12/20/2015 0803   ALKPHOS 45 12/20/2015 0803   AST 19 12/20/2015 0803   ALT 21 12/20/2015 0803   BILITOT 0.7 12/20/2015 0803      Urine dip: 2+ blood (chronic).  ASSESSMENT/PLAN:  Annual physical exam - Plan: POCT Urinalysis Dipstick, Visual acuity screening, Comprehensive metabolic panel, CBC with Differential/Platelet, VITAMIN D 25 Hydroxy (Vit-D Deficiency, Fractures)  Medication monitoring encounter - Plan: Comprehensive metabolic panel, CBC with Differential/Platelet  Other fatigue - Plan: Comprehensive metabolic panel, CBC with Differential/Platelet, VITAMIN D 25 Hydroxy (Vit-D Deficiency, Fractures)   c-met, CBC, Vit D  Discussed monthly self breast exams and yearly mammograms; at least 30 minutes of aerobic activity at least 5 days/week, weight bearing exercise 2xwk; proper sunscreen use reviewed; healthy diet, including goals of calcium and vitamin D intake and alcohol recommendations (less than or equal to 1 drink/day) reviewed; regular seatbelt use; changing batteries in smoke detectors. Immunization recommendations discussed-- continue flu shots  annually. Recommended Shingrix, when available (if covered/affordable). Colonoscopy recommendations reviewed--due again next year (04/2017). hemasure kit given. Pap due 2019. Cut back on alcohol to 1/day.

## 2016-05-13 ENCOUNTER — Ambulatory Visit (INDEPENDENT_AMBULATORY_CARE_PROVIDER_SITE_OTHER): Payer: Managed Care, Other (non HMO) | Admitting: Family Medicine

## 2016-05-13 ENCOUNTER — Encounter: Payer: Self-pay | Admitting: Family Medicine

## 2016-05-13 VITALS — BP 130/80 | HR 60 | Temp 97.3°F | Ht 68.0 in | Wt 149.0 lb

## 2016-05-13 DIAGNOSIS — R5383 Other fatigue: Secondary | ICD-10-CM | POA: Diagnosis not present

## 2016-05-13 DIAGNOSIS — Z0000 Encounter for general adult medical examination without abnormal findings: Secondary | ICD-10-CM | POA: Diagnosis not present

## 2016-05-13 DIAGNOSIS — Z5181 Encounter for therapeutic drug level monitoring: Secondary | ICD-10-CM | POA: Diagnosis not present

## 2016-05-13 LAB — CBC WITH DIFFERENTIAL/PLATELET
BASOS PCT: 1 %
Basophils Absolute: 51 cells/uL (ref 0–200)
EOS ABS: 51 {cells}/uL (ref 15–500)
Eosinophils Relative: 1 %
HEMATOCRIT: 38.5 % (ref 35.0–45.0)
Hemoglobin: 12.9 g/dL (ref 11.7–15.5)
LYMPHS PCT: 30 %
Lymphs Abs: 1530 cells/uL (ref 850–3900)
MCH: 32.8 pg (ref 27.0–33.0)
MCHC: 33.5 g/dL (ref 32.0–36.0)
MCV: 98 fL (ref 80.0–100.0)
MONO ABS: 510 {cells}/uL (ref 200–950)
MONOS PCT: 10 %
MPV: 9.2 fL (ref 7.5–12.5)
NEUTROS ABS: 2958 {cells}/uL (ref 1500–7800)
Neutrophils Relative %: 58 %
PLATELETS: 245 10*3/uL (ref 140–400)
RBC: 3.93 MIL/uL (ref 3.80–5.10)
RDW: 12.9 % (ref 11.0–15.0)
WBC: 5.1 10*3/uL (ref 4.0–10.5)

## 2016-05-13 LAB — POCT URINALYSIS DIPSTICK
Bilirubin, UA: NEGATIVE
GLUCOSE UA: NEGATIVE
Ketones, UA: NEGATIVE
Leukocytes, UA: NEGATIVE
NITRITE UA: NEGATIVE
Protein, UA: NEGATIVE
SPEC GRAV UA: 1.015
UROBILINOGEN UA: NEGATIVE
pH, UA: 6

## 2016-05-13 LAB — COMPREHENSIVE METABOLIC PANEL
ALBUMIN: 4.6 g/dL (ref 3.6–5.1)
ALT: 21 U/L (ref 6–29)
AST: 22 U/L (ref 10–35)
Alkaline Phosphatase: 41 U/L (ref 33–130)
BUN: 9 mg/dL (ref 7–25)
CALCIUM: 9.5 mg/dL (ref 8.6–10.4)
CHLORIDE: 101 mmol/L (ref 98–110)
CO2: 27 mmol/L (ref 20–31)
CREATININE: 0.54 mg/dL (ref 0.50–1.05)
Glucose, Bld: 95 mg/dL (ref 65–99)
Potassium: 3.8 mmol/L (ref 3.5–5.3)
SODIUM: 138 mmol/L (ref 135–146)
TOTAL PROTEIN: 6.9 g/dL (ref 6.1–8.1)
Total Bilirubin: 0.7 mg/dL (ref 0.2–1.2)

## 2016-05-13 NOTE — Patient Instructions (Addendum)
  HEALTH MAINTENANCE RECOMMENDATIONS:  It is recommended that you get at least 30 minutes of aerobic exercise at least 5 days/week (for weight loss, you may need as much as 60-90 minutes). This can be any activity that gets your heart rate up. This can be divided in 10-15 minute intervals if needed, but try and build up your endurance at least once a week.  Weight bearing exercise is also recommended twice weekly.  Eat a healthy diet with lots of vegetables, fruits and fiber.  "Colorful" foods have a lot of vitamins (ie green vegetables, tomatoes, red peppers, etc).  Limit sweet tea, regular sodas and alcoholic beverages, all of which has a lot of calories and sugar.  Up to 1 alcoholic drink daily may be beneficial for women (unless trying to lose weight, watch sugars).  Drink a lot of water.  Calcium recommendations are 1200-1500 mg daily (1500 mg for postmenopausal women or women without ovaries), and vitamin D 1000 IU daily.  This should be obtained from diet and/or supplements (vitamins), and calcium should not be taken all at once, but in divided doses.  Monthly self breast exams and yearly mammograms for women over the age of 32 is recommended.  Sunscreen of at least SPF 30 should be used on all sun-exposed parts of the skin when outside between the hours of 10 am and 4 pm (not just when at beach or pool, but even with exercise, golf, tennis, and yard work!)  Use a sunscreen that says "broad spectrum" so it covers both UVA and UVB rays, and make sure to reapply every 1-2 hours.  Remember to change the batteries in your smoke detectors when changing your clock times in the spring and fall.  Use your seat belt every time you are in a car, and please drive safely and not be distracted with cell phones and texting while driving.  Consider Shingrix vaccine--check with your insurance to see if it is covered/affordable and contact us (it is not yet available).  Schedule your yearly mammogram.

## 2016-05-14 LAB — VITAMIN D 25 HYDROXY (VIT D DEFICIENCY, FRACTURES): Vit D, 25-Hydroxy: 57 ng/mL (ref 30–100)

## 2016-06-05 LAB — HM MAMMOGRAPHY

## 2016-07-10 ENCOUNTER — Encounter: Payer: Self-pay | Admitting: Family Medicine

## 2016-07-24 ENCOUNTER — Other Ambulatory Visit: Payer: Self-pay | Admitting: Family Medicine

## 2016-07-25 MED ORDER — MELOXICAM 15 MG PO TABS: 15.0000 mg | ORAL_TABLET | Freq: Every day | ORAL | 0 refills | Status: DC | PRN

## 2016-12-17 ENCOUNTER — Other Ambulatory Visit: Payer: Self-pay | Admitting: Family Medicine

## 2016-12-18 MED ORDER — MELOXICAM 15 MG PO TABS: 15.0000 mg | ORAL_TABLET | Freq: Every day | ORAL | 0 refills | Status: DC | PRN

## 2016-12-18 NOTE — Telephone Encounter (Signed)
Last filled February

## 2017-05-09 LAB — HM COLONOSCOPY

## 2017-05-14 NOTE — Progress Notes (Signed)
Chief Complaint  Patient presents with  . Annual Exam    fasting annual exam with pelvic (last pap 2014). Had colonoscopy last Friday with Dr Collene Mares. Will schuedule eye exam for next month. Thinks she may have Reynauds wants to discuss. Also right side above ear has "ice pick" type HA's.     Shannon [name] is a 56 y.o. female who presents for a complete physical.  She has the following concerns:  She complains of "arthritis pain" in her right index finger for the last few months.  Pain isn't constant, but has pain along the radial side of the index finger that can be quite severe and sudden with certain movements (picking things up). It is very intermittent.  It hasn't swollen.  She is worried that it could spread to other fingers.  Raynaud's has been happening daily since the weather got cold--occurs while driving to work in the morning.    Stabbing, "ice pick" headaches in the area above the right ear, intermittently for many years.  It has increased in frequency in the last 4 months, to about once a week. Lasts a few hours to 1/2 day.  excedrin migraine and ibuprofen helps some.  Currently can't take ibuprofen (for a month, s/p many polyps removed from recent colonoscopy). She has known bruxism, wears a night guard.  Neck adjustments from the chiropractor help a lot.  She has not tried taking a muscle relaxant (no longer has any left).  She has some chronic back pain. It has been a little sore in the muscles lately, since not walking as much.  She goes through spurts with taking the meloxicam, likes to have it on hand, hadn't been using it very recently.  Doesn't need it as much over the summer.  Has just a few left, requesting refill. Meloxicam was last refilled #90 in July.  No longer having SI issues or radiculopathy.  Immunization History  Administered Date(s) Administered  . Influenza Split 03/08/2014  . Influenza,inj,Quad PF,6+ Mos 03/15/2013, 03/29/2016  . Influenza-Unspecified  04/04/2015, 03/24/2017  . Td 02/21/2005  . Tdap 03/31/2014   Last Pap smear: 03/2013--normal, with no high risk HPV  Last mammogram: 05/2016 Last colonoscopy: 05/09/17, +polyps. Path pending. 3 year f/u recommended based on size/#.  Await path (Dr. Collene Mares) Last DEXA: 03/19/07, normal  Dentist: every 6 months  Ophtho: every other year, due soon. Exercise: Gets 150 mins/week of cardio.  Has been trying to get weight-bearing exercise 2-3 times/week. Hasn't had a period in over 15 years--perhaps just a couple of periods after coming off of Depo Provera (around age 38, after high [place] noted in 2011) Lipids: Lab Results  Component Value Date   CHOL 226 (H) 05/11/2015   HDL 138 05/11/2015   LDLCALC 82 05/11/2015   TRIG 31 05/11/2015   CHOLHDL 1.6 05/11/2015   She reports having lipids checked for work/insurance through [facility] in June, with high HDL. Vitamin D-OH 57 in 05/2016  Past Medical History:  Diagnosis Date  . Benign hematuria    negative workup in past (in [place])  . Cerebral AVM    Dr. Christella Noa  . GERD (gastroesophageal reflux disease)   . IBS (irritable bowel syndrome)     Past Surgical History:  Procedure Laterality Date  . BRAIN SURGERY  03/2002   R posterior frontal AVM resection   . ORIF DISTAL RADIUS FRACTURE Left 11/05/11   Dr. Mardelle Matte  . SPINE SURGERY  2007   Anterior cervical decompression C5-C6 and C6-C7  Social History   Socioeconomic History  . Marital status: Single    Spouse name: Not on file  . Number of children: Not on file  . Years of education: Not on file  . Highest education level: Not on file  Social Needs  . Financial resource strain: Not on file  . Food insecurity - worry: Not on file  . Food insecurity - inability: Not on file  . Transportation needs - medical: Not on file  . Transportation needs - non-medical: Not on file  Occupational History  . Occupation: Clinical research associate Building control surveyor)    Employer: [employer]  Tobacco Use  .  Smoking status: Former Smoker    Last attempt to quit: 10/08/2004    Years since quitting: 12.6  . Smokeless tobacco: Never Used  Substance and Sexual Activity  . Alcohol use: Yes    Comment: 3 drinks per day (bourbon and water--1 shot of bourbon)  . Drug use: No    Comment: occasional edible marijuana from [place] (less than once a month)  . Sexual activity: Not Currently    Partners: Female  Other Topics Concern  . Not on file  Social History Narrative   Lives with 3 dogs    Family History  Problem Relation Age of Onset  . Hypertension Father   . Diabetes Father   . Hypertension Mother   . Transient ischemic attack Mother   . Thyroid disease Mother   . Diabetes Sister   . Thyroid disease Sister   . Seizures Brother        childhood; resolved  . Heart disease Maternal Grandmother   . Cancer Paternal Aunt        breast  . Breast cancer Paternal Aunt        42's  . Prostate cancer Cousin   . Cancer Cousin   . Cancer Cousin        brain    Outpatient Encounter Medications as of 05/15/2017  Medication Sig Note  . CALCIUM CITRATE PO Take 1 tablet by mouth 2 (two) times daily.     . Cholecalciferol (VITAMIN D) 1000 UNITS capsule Take 1,000 Units by mouth daily.     . Cyanocobalamin (B-12) 100 MCG TABS Take 1 tablet by mouth daily.   . famotidine (PEPCID) 10 MG tablet Take 10 mg by mouth daily.     . Multiple Vitamins-Minerals (MULTIVITAMIN WITH MINERALS) tablet Take 1 tablet by mouth daily.     . Omega-3 Fatty Acids (FISH OIL) 1000 MG CAPS Take 1 capsule by mouth 2 (two) times daily.    . vitamin C (ASCORBIC ACID) 500 MG tablet Take 500 mg by mouth 2 (two) times daily.     Marland Kitchen aspirin 81 MG tablet Take 81 mg by mouth daily.   05/15/2017: Takes daily--stopped only for recent colonoscopy; will restart next month  . meloxicam (MOBIC) 15 MG tablet Take 1 tablet (15 mg total) by mouth daily as needed for pain. (Patient not taking: Reported on 05/15/2017) 05/15/2017: Uses prn  .  [DISCONTINUED] glucosamine-chondroitin 500-400 MG tablet Take 2 tablets by mouth daily.   . [DISCONTINUED] methocarbamol (ROBAXIN) 500 MG tablet Take 1-2 tablets (500-1,000 mg total) by mouth every 6 (six) hours as needed for muscle spasms. (Patient not taking: Reported on 05/13/2016) 05/13/2016: Hasn't needed in about a year   No facility-administered encounter medications on file as of 05/15/2017.     Allergies  Allergen Reactions  . Lanolin Rash  . Latex Rash  ROS: The patient denies anorexia, fever, weight changes, vision changes, decreased hearing, ear pain, sore throat, breast concerns, chest pain, palpitations, dizziness, syncope, dyspnea on exertion, cough, swelling, nausea, vomiting, diarrhea, constipation, abdominal pain, melena, hematochezia, incontinence, dysuria, postmenopausal bleeding, vaginal discharge, odor or itch, genital lesions, weakness, tremor, suspicious skin lesions, depression, anxiety, abnormal bleeding/bruising, or enlarged lymph nodes. +Residual numbness L hand/arm (since brain surgery). This remains unchanged. Denies any weakness, but her motor skills aren't as good in that hand (noticed in her typing).  Reflux if doesn't take Pepcid (likes spicy food, bourbon). Denies any abdominal pain or dysphagia.  H/o microscopic hematuria--no visible hematuria.  Denies hot flashes, night sweats. Occasional foot/ankle cramps at night--much less often (using arch supports, massages with golf ball) Stools are unchanged--loose in the mornings (after coffee). No hemorrhoidal bleeding Back pain, finger pain per HPI. Headaches and raynaud's per HPI   PHYSICAL EXAM:  BP 118/72   Pulse 60   Ht 5' 7.25" (1.708 m)   Wt 149 lb 12.8 oz (67.9 kg)   LMP 11/25/1997   BMI 23.29 kg/m   Wt Readings from Last 3 Encounters:  05/15/17 149 lb 12.8 oz (67.9 kg)  05/13/16 149 lb (67.6 kg)  05/11/15 149 lb (67.6 kg)    General Appearance:  Alert, cooperative, no distress, appears  stated age   Head:  Normocephalic, without obvious abnormality, atraumatic   Eyes:  PERRL, conjunctiva/corneas clear, EOM's intact, fundi benign   Ears:  Normal TM's and external ear canals   Nose:  Nares normal, mucosa mild edema; no drainage or sinus tenderness   Throat:  Lips, mucosa, and tongue normal; teeth and gums normal.   Neck:  Supple, normal ROM; thyroid: no enlargement/tenderness/nodules; no carotid bruit or JVD.   Back:  Spine nontender, no curvature, no CVA tenderness. No SI joint tenderness. No muscle spasm.  Lungs:  Clear to auscultation bilaterally without wheezes, rales or ronchi; respirations unlabored   Chest Wall:  No tenderness or deformity   Heart:  Regular rate and rhythm, S1 and S2 normal, no murmur, rub or gallop   Breast Exam:  No tenderness, masses, or nipple discharge or inversion. No axillary lymphadenopathy  Abdomen:  Soft, non-tender, nondistended, normoactive bowel sounds, no masses, no hepatosplenomegaly   Genitalia:  Normal external genitalia without lesions. BUS and vagina normal; no cervical motion tenderness. Uterus and adnexa not enlarged, nontender, no masses. Pap not performed   Rectal:  Deferred (colonoscopy last week)   Extremities:  No clubbing, cyanosis or edema. Index finger--nontender, no swelling, no reproducible pain.  Pulses:  2+ and symmetric all extremities   Skin:  Skin color, texture, turgor normal, no rashes or lesions. Many tattoos   Lymph nodes:  Cervical, supraclavicular, and axillary nodes normal.   Neurologic:  CNII-XII intact, normal strength, sensation and gait; reflexes 2+ in lower extremities.  Psych:  Normal mood, affect, hygiene and grooming   ASSESSMENT/PLAN:  Annual physical exam - Plan: POCT Urinalysis DIP (Proadvantage Device)  Medication monitoring encounter  Low back pain without sciatica - infrequent/improved. NSAID prn (when allowed to resume) - Plan:  meloxicam (MOBIC) 15 MG tablet, methocarbamol (ROBAXIN) 500 MG tablet  Tension headache - muscular headaches (likely contributed some by her bruxism) - Plan: methocarbamol (ROBAXIN) 500 MG tablet  Raynaud's phenomenon without gangrene - discussed preventative measures.  Not interested in meds  Finger pain, right - index finger, intermittent. normal exam   All labs done recently by Dr. Collene Mares, except lipids. CBC, c-met,  TSH, nonfasting glu 74 Recent normal lipids for work, last lipids here with excellent HDL.   Finger pain--discussed that given location, suspect more strain vs overuse, NOT arthritis (no swelling, morning stiffness; pain is sharp, shortlived, with certain activities).   Await pathology report from Dr. Collene Mares  Discussed monthly self breast exams and yearly mammograms; at least 30 minutes of aerobic activity at least 5 days/week, weight bearing exercise 2xwk; proper sunscreen use reviewed; healthy diet, including goals of calcium and vitamin D intake and alcohol recommendations (less than or equal to 1 drink/day) reviewed; regular seatbelt use; changing batteries in smoke detectors. Immunization recommendations discussed-- continue flu shots annually. Recommended Shingrix (schedule [place] if covered/affordable). Colonoscopy recommendations reviewed, UTD, await pathology report, repeat 3 years per Dr. Lorie Apley initial recommendation. Pap next year Decrease alcohol to 1/day, risks reviewed

## 2017-05-15 ENCOUNTER — Ambulatory Visit (INDEPENDENT_AMBULATORY_CARE_PROVIDER_SITE_OTHER): Payer: Managed Care, Other (non HMO) | Admitting: Family Medicine

## 2017-05-15 ENCOUNTER — Encounter: Payer: Self-pay | Admitting: Family Medicine

## 2017-05-15 VITALS — BP 118/72 | HR 60 | Ht 67.25 in | Wt 149.8 lb

## 2017-05-15 DIAGNOSIS — M545 Low back pain, unspecified: Secondary | ICD-10-CM

## 2017-05-15 DIAGNOSIS — Z0000 Encounter for general adult medical examination without abnormal findings: Secondary | ICD-10-CM | POA: Diagnosis not present

## 2017-05-15 DIAGNOSIS — I7300 Raynaud's syndrome without gangrene: Secondary | ICD-10-CM | POA: Diagnosis not present

## 2017-05-15 DIAGNOSIS — G44209 Tension-type headache, unspecified, not intractable: Secondary | ICD-10-CM | POA: Diagnosis not present

## 2017-05-15 DIAGNOSIS — M79644 Pain in right finger(s): Secondary | ICD-10-CM

## 2017-05-15 DIAGNOSIS — Z5181 Encounter for therapeutic drug level monitoring: Secondary | ICD-10-CM

## 2017-05-15 LAB — POCT URINALYSIS DIP (PROADVANTAGE DEVICE)
Bilirubin, UA: NEGATIVE
Blood, UA: NEGATIVE
Glucose, UA: NEGATIVE mg/dL
Ketones, POC UA: NEGATIVE mg/dL
Leukocytes, UA: NEGATIVE
Nitrite, UA: NEGATIVE
Protein Ur, POC: NEGATIVE mg/dL
Specific Gravity, Urine: 1.01
Urobilinogen, Ur: NEGATIVE
pH, UA: 6 (ref 5.0–8.0)

## 2017-05-15 MED ORDER — METHOCARBAMOL 500 MG PO TABS: 500.0000 mg | ORAL_TABLET | Freq: Four times a day (QID) | ORAL | 0 refills | Status: DC | PRN

## 2017-05-15 MED ORDER — MELOXICAM 15 MG PO TABS: 15.0000 mg | ORAL_TABLET | Freq: Every day | ORAL | 0 refills | Status: DC | PRN

## 2017-05-15 NOTE — Patient Instructions (Addendum)
HEALTH MAINTENANCE RECOMMENDATIONS:  It is recommended that you get at least 30 minutes of aerobic exercise at least 5 days/week (for weight loss, you may need as much as 60-90 minutes). This can be any activity that gets your heart rate up. This can be divided in 10-15 minute intervals if needed, but try and build up your endurance at least once a week.  Weight bearing exercise is also recommended twice weekly.  Eat a healthy diet with lots of vegetables, fruits and fiber.  "Colorful" foods have a lot of vitamins (ie green vegetables, tomatoes, red peppers, etc).  Limit sweet tea, regular sodas and alcoholic beverages, all of which has a lot of calories and sugar.  Up to 1 alcoholic drink daily may be beneficial for women (unless trying to lose weight, watch sugars).  Drink a lot of water.  Calcium recommendations are 1200-1500 mg daily (1500 mg for postmenopausal women or women without ovaries), and vitamin D 1000 IU daily.  This should be obtained from diet and/or supplements (vitamins), and calcium should not be taken all at once, but in divided doses.  Monthly self breast exams and yearly mammograms for women over the age of 73 is recommended.  Sunscreen of at least SPF 30 should be used on all sun-exposed parts of the skin when outside between the hours of 10 am and 4 pm (not just when at beach or pool, but even with exercise, golf, tennis, and yard work!)  Use a sunscreen that says "broad spectrum" so it covers both UVA and UVB rays, and make sure to reapply every 1-2 hours.  Remember to change the batteries in your smoke detectors when changing your clock times in the spring and fall. I recommend a carbon monoxide detector.  Use your seat belt every time you are in a car, and please drive safely and not be distracted with cell phones and texting while driving.  I recommend getting the new shingles vaccine (Shingrix). You will need to check with your insurance to see if it is covered, and if  covered by Medicare Part D, you need to get from the pharmacy rather than our office.  It is a series of 2 injections, spaced 2 months apart.  Try taking the methocarbamol as needed for muscular pain/temporal headaches (especially while you cannot take NSAIDs).    Raynaud Phenomenon Raynaud phenomenon is a condition that affects the blood vessels (arteries) that carry blood to your fingers and toes. The arteries that supply blood to your ears or the tip of your nose might also be affected. Raynaud phenomenon causes the arteries to temporarily narrow. As a result, the flow of blood to the affected areas is temporarily decreased. This usually occurs in response to cold temperatures or stress. During an attack, the skin in the affected areas turns white. You may also feel tingling or numbness in those areas. Attacks usually last for only a brief period, and then the blood flow to the area returns to normal. In most cases, Raynaud phenomenon does not cause serious health problems. What are the causes? For many people with this condition, the cause is not known. Raynaud phenomenon is sometimes associated with other diseases, such as scleroderma or lupus. What increases the risk? Raynaud phenomenon can affect anyone, but it develops most often in people who are 81-54 years old. It affects more females than males. What are the signs or symptoms? Symptoms of Raynaud phenomenon may occur when you are exposed to cold temperatures or when  you have emotional stress. The symptoms may last for a few minutes or up to several hours. They usually affect your fingers but may also affect your toes, ears, or the tip of your nose. Symptoms may include:  Changes in skin color. The skin in the affected areas will turn pale or white. The skin may then change from white to bluish to red as normal blood flow returns to the area.  Numbness, tingling, or pain in the affected areas.  In severe cases, sores may develop in the  affected areas. How is this diagnosed? Your health care provider will do a physical exam and take your medical history. You may be asked to put your hands in cold water to check for a reaction to cold temperature. Blood tests may be done to check for other diseases or conditions. Your health care provider may also order a test to check the movement of blood through your arteries and veins (vascular ultrasound). How is this treated? Treatment often involves making lifestyle changes and taking steps to control your exposure to cold temperatures. For more severe cases, medicine (calcium channel blockers) may be used to improve blood flow. Surgery is sometimes done to block the nerves that control the affected arteries, but this is rare. Follow these instructions at home:  Avoid exposure to cold by taking these steps: ? If possible, stay indoors during cold weather. ? When you go outside during cold weather, dress in layers and wear mittens, a hat, a scarf, and warm footwear. ? Wear mittens or gloves when handling ice or frozen food. ? Use holders for glasses or cans containing cold drinks. ? Let warm water run for a while before taking a shower or bath. ? Warm up the car before driving in cold weather.  If possible, avoid stressful and emotional situations. Exercise, meditation, and yoga may help you cope with stress. Biofeedback may be useful.  Do not use any tobacco products, including cigarettes, chewing tobacco, or electronic cigarettes. If you need help quitting, ask your health care provider.  Avoid secondhand smoke.  Limit your use of caffeine. Switch to decaffeinated coffee, tea, and soda. Avoid chocolate.  Wear loose fitting socks and comfortable, roomy shoes.  Avoid vibrating tools and machinery.  Take medicines only as directed by your health care provider. Contact a health care provider if:  Your discomfort becomes worse despite lifestyle changes.  You develop sores on your  fingers or toes that do not heal.  Your fingers or toes turn black.  You have breaks in the skin on your fingers or toes.  You have a fever.  You have pain or swelling in your joints.  You have a rash.  Your symptoms occur on only one side of your body. This information is not intended to replace advice given to you by your health care provider. Make sure you discuss any questions you have with your health care provider. Document Released: 05/24/2000 Document Revised: 11/02/2015 Document Reviewed: 11/29/2015 Elsevier Interactive Patient Education  2017 [employer].

## 2017-05-18 DIAGNOSIS — I7300 Raynaud's syndrome without gangrene: Secondary | ICD-10-CM | POA: Insufficient documentation

## 2017-05-20 NOTE — Progress Notes (Signed)
Called over and they just got it-will have to call back in another week or two as they will not send until Dr.Mann reviews it.

## 2017-05-21 ENCOUNTER — Encounter: Payer: Self-pay | Admitting: Family Medicine

## 2017-05-21 ENCOUNTER — Encounter: Payer: Self-pay | Admitting: *Deleted

## 2017-05-27 ENCOUNTER — Encounter: Payer: Self-pay | Admitting: Family Medicine

## 2017-06-06 LAB — HM MAMMOGRAPHY

## 2017-07-23 ENCOUNTER — Other Ambulatory Visit: Payer: Self-pay | Admitting: Family Medicine

## 2017-07-23 DIAGNOSIS — M545 Low back pain, unspecified: Secondary | ICD-10-CM

## 2017-09-17 LAB — LIPID PANEL
Cholesterol: 209 — AB (ref 0–200)
HDL: 78 — AB (ref 35–70)
LDL Cholesterol: 118
Triglycerides: 65 (ref 40–160)

## 2017-10-22 ENCOUNTER — Other Ambulatory Visit: Payer: Self-pay | Admitting: Family Medicine

## 2017-10-22 DIAGNOSIS — M545 Low back pain, unspecified: Secondary | ICD-10-CM

## 2017-10-23 MED ORDER — MELOXICAM 15 MG PO TABS: 15.0000 mg | ORAL_TABLET | Freq: Every day | ORAL | 0 refills | Status: DC | PRN

## 2017-10-23 NOTE — Telephone Encounter (Signed)
Is this okay to refill? 

## 2017-10-23 NOTE — Telephone Encounter (Signed)
Discussed at CPE 05/2017 for prn use. Had labs through [facility] in 04/2017, normal renal function.  Okay to refill this time.

## 2018-01-26 ENCOUNTER — Other Ambulatory Visit: Payer: Self-pay | Admitting: Family Medicine

## 2018-01-26 DIAGNOSIS — M545 Low back pain, unspecified: Secondary | ICD-10-CM

## 2018-01-26 DIAGNOSIS — G44209 Tension-type headache, unspecified, not intractable: Secondary | ICD-10-CM

## 2018-01-26 MED ORDER — METHOCARBAMOL 500 MG PO TABS: 500.0000 mg | ORAL_TABLET | Freq: Four times a day (QID) | ORAL | 0 refills | Status: DC | PRN

## 2018-01-26 NOTE — Telephone Encounter (Signed)
[place] for the robaxin.  But not the meloxicam. Looks like she has been using it daily (last filled x 90 3 mos ago.  She needs to schedule OV, and if truly needs daily, needs labs (which can be done at visit). At her last visit in December, she had not been using it daily (filled December, May)

## 2018-01-26 NOTE — Telephone Encounter (Signed)
Is this okay to refill? 

## 2018-01-29 ENCOUNTER — Encounter: Payer: Self-pay | Admitting: Family Medicine

## 2018-01-30 ENCOUNTER — Encounter: Payer: Self-pay | Admitting: Family Medicine

## 2018-05-20 ENCOUNTER — Encounter: Payer: Managed Care, Other (non HMO) | Admitting: Family Medicine

## 2018-06-09 LAB — HM MAMMOGRAPHY

## 2018-06-10 NOTE — Progress Notes (Signed)
Chief Complaint  Patient presents with  . Annual Exam    fasting annual exam with pap. Sees eye doctor for eye exams. Did not yet check with insurance about Shingrix but she will. Has been having lots of drainage that started today.     Shannon [name] is a 58 y.o. female who presents for a complete physical.  She has the following concerns:  postnasal drainage started today, with some throat-clearing.  Denies any other URI symptoms or fever.  Raynaud's a few times/week in the hands (worse when much colder, hasn't been too cold), and recently started noticing it in her toes some as well.  Mostly notices when driving.  Stabbing, "ice pick" headaches in the area above the right ear, intermittently for many years.  Much less often now than last year. Last was about a week ago, was the first in a long time. At last physical, frequency had increased to once weekly. Lasts a few hours to 1/2 day.  Excedrin migraine and ibuprofen helps some.  She has known bruxism, wears a night guard.  Neck adjustments from the chiropractor help a lot.  She has robaxin to use prn, hasn't needed in a while (last filled #60 in 01/2018).  Uses the muscle relaxant more for her back than for tension headaches.  She has somechronicback pain.  She previously took meloxicam, but switched to ibuprofen.  Doesn't need NSAIDs as much over the summer. Last filled Meloxicam #90 10/23/17.  She has been doing well using ibuprofen prn, along with Turmeric and topical CBD oil. This has been managing her pain.  She has been having R SI issues, treated by chiro, a little better at the moment (sees them 1-2x/month).    Immunization History  Administered Date(s) Administered  . Influenza Split 03/08/2014  . Influenza,inj,Quad PF,6+ Mos 03/15/2013, 03/29/2016  . Influenza-Unspecified 04/04/2015, 03/24/2017  . Td 02/21/2005  . Tdap 03/31/2014   Had flu shot in October Last Pap smear: 03/2013--normal, with no high risk HPV  Last  mammogram: 05/2018 (result not yet received, [facility]) Last colonoscopy: 05/09/17, +polyps (2 adenomatous, 1 hyperplastic). 3 year f/u recommended (Dr. Collene Mares) Last DEXA: 03/19/07, normal  Dentist: every 6 months  Ophtho: goes yearly, in the Spring Exercise:Gets 150 mins/week of cardio (walks--treadmill at home or at work on lunches/breaks).  Has been trying to get weight-bearing exercise 2-3 times/week. Hasn't had a period in over 15 years--perhaps just a couple of periods after coming off of Depo Provera (around age 44, after high [place] noted in 2011) Lipids: Lab Results  Component Value Date   CHOL 226 (H) 05/11/2015   HDL 138 05/11/2015   LDLCALC 82 05/11/2015   TRIG 31 05/11/2015   CHOLHDL 1.6 05/11/2015  She reports having lipids checked for work/insurance through [facility] in June, 2018 with high HDL. Recalls she had it done again in 10/2017 and it was similar. She will try and send [place] a screen shot of the results. Vitamin D-OH 57 in 05/2016  Past Medical History:  Diagnosis Date  . Benign hematuria    negative workup in past (in [place])  . Cerebral AVM    Dr. Christella Noa  . GERD (gastroesophageal reflux disease)   . IBS (irritable bowel syndrome)     Past Surgical History:  Procedure Laterality Date  . BRAIN SURGERY  03/2002   R posterior frontal AVM resection   . ORIF DISTAL RADIUS FRACTURE Left 11/05/11   Dr. Mardelle Matte  . [place] SURGERY  2007  Anterior cervical decompression C5-C6 and C6-C7    Social History   Socioeconomic History  . Marital status: Single    Spouse name: Not on file  . Number of children: Not on file  . Years of education: Not on file  . Highest education level: Not on file  Occupational History  . Occupation: Clinical research associate Building control surveyor)    Employer: [employer]  Social Needs  . Financial resource strain: Not on file  . Food insecurity:    Worry: Not on file    Inability: Not on file  . Transportation needs:    Medical: Not on file     Non-medical: Not on file  Tobacco Use  . Smoking status: Former Smoker    Last attempt to quit: 10/08/2004    Years since quitting: 13.6  . Smokeless tobacco: Never Used  Substance and Sexual Activity  . Alcohol use: Yes    Alcohol/week: 15.0 - 20.0 standard drinks    Types: 15 - 20 Shots of liquor per week    Comment: 3 drinks per day (bourbon and water--1 shot of bourbon)  . Drug use: No    Comment: occasional edible marijuana from [place] (less than once a month)  . Sexual activity: Not Currently    Partners: Female  Lifestyle  . Physical activity:    Days per week: Not on file    Minutes per session: Not on file  . Stress: Not on file  Relationships  . Social connections:    Talks on phone: Not on file    Gets together: Not on file    Attends religious service: Not on file    Active member of club or organization: Not on file    Attends meetings of clubs or organizations: Not on file    Relationship status: Not on file  . Intimate partner violence:    Fear of current or ex partner: Not on file    Emotionally abused: Not on file    Physically abused: Not on file    Forced sexual activity: Not on file  Other Topics Concern  . Not on file  Social History Narrative   Lives with 3 dogs    Family History  Problem Relation Age of Onset  . Hypertension Father   . Diabetes Father   . Hypertension Mother   . Transient ischemic attack Mother   . Thyroid disease Mother   . Diabetes Sister   . Thyroid disease Sister   . Seizures Brother        childhood; resolved  . Heart disease Maternal Grandmother   . Cancer Paternal Aunt        breast  . Breast cancer Paternal Aunt        50's  . Prostate cancer Cousin   . Cancer Cousin   . Cancer Cousin        brain    Outpatient Encounter Medications as of 06/11/2018  Medication Sig Note  . CALCIUM CITRATE PO Take 1 tablet by mouth 2 (two) times daily.     . Cholecalciferol (VITAMIN D) 1000 UNITS capsule Take 1,000 Units by  mouth daily.     . famotidine (PEPCID) 10 MG tablet Take 10 mg by mouth daily.   06/11/2018: Unsure of dose, may be 20mg   . Multiple Vitamins-Minerals (MULTIVITAMIN WITH MINERALS) tablet Take 1 tablet by mouth daily.     . Omega-3 Fatty Acids (FISH OIL) 1000 MG CAPS Take 1 capsule by mouth 2 (two) times daily.    Marland Kitchen  TURMERIC PO Take 1 tablet by mouth daily.   . vitamin C (ASCORBIC ACID) 500 MG tablet Take 500 mg by mouth 2 (two) times daily.     . methocarbamol (ROBAXIN) 500 MG tablet Take 1-2 tablets (500-1,000 mg total) by mouth every 6 (six) hours as needed for muscle spasms. (Patient not taking: Reported on 06/11/2018) 06/11/2018: Uses prn, mostly for back pain  . [DISCONTINUED] aspirin 81 MG tablet Take 81 mg by mouth daily.   05/15/2017: Takes daily--stopped only for recent colonoscopy; will restart next month  . [DISCONTINUED] Cyanocobalamin (B-12) 100 MCG TABS Take 1 tablet by mouth daily.   . [DISCONTINUED] meloxicam (MOBIC) 15 MG tablet Take 1 tablet (15 mg total) by mouth daily as needed for pain.    No facility-administered encounter medications on file as of 06/11/2018.     Allergies  Allergen Reactions  . Lanolin Rash  . Latex Rash    ROS: The patient denies anorexia, fever, weight changes, vision changes, decreased hearing, ear pain, sore throat, breast concerns, chest pain, palpitations, dizziness, syncope, dyspnea on exertion, cough, swelling, nausea, vomiting, diarrhea, constipation, abdominal pain, melena, hematochezia, incontinence, dysuria, postmenopausal bleeding, vaginal discharge, odor or itch, genital lesions, weakness, tremor, suspicious skin lesions, depression, anxiety, abnormal bleeding/bruising, or enlarged lymph nodes. +Residual numbness L hand/arm (since brain surgery). This remains unchanged. Denies any weakness, but her motor skills aren't as good in that hand (noticed in her typing).  Reflux if doesn't take Pepcid (likes spicy food, bourbon). Denies any abdominal pain  or dysphagia. H/o microscopic hematuria--no visible hematuria.  Denies hot flashes, night sweats. Occasional foot/ankle cramps at night, R>L--much less often (using arch supports, massages with golf ball) Stools are unchanged--loose in the mornings(after coffee).No hemorrhoidal bleeding Back pain per HPI, periodically flares, with R SI pain Headaches and raynaud's per HPI, not as often as last year.    PHYSICAL EXAM:  BP 122/76   Pulse 72   Temp (!) 97 F (36.1 C) (Tympanic)   Ht 5\' 7"  (1.702 m)   Wt 152 lb 3.2 oz (69 kg)   LMP 11/25/1997   BMI 23.84 kg/m   Wt Readings from Last 3 Encounters:  06/11/18 152 lb 3.2 oz (69 kg)  05/15/17 149 lb 12.8 oz (67.9 kg)  05/13/16 149 lb (67.6 kg)     General Appearance:  Alert, cooperative, no distress, appears stated age   Head:  Normocephalic, without obvious abnormality, atraumatic   Eyes:  PERRL, conjunctiva/corneas clear, EOM's intact, fundi benign   Ears:  Normal TM's and external ear canals   Nose:  Nares normal, mucosamild edema, mildly erythematous;no purulent drainage or sinus tenderness   Throat:  Lips, mucosa, and tongue normal; teeth and gums normal.   Neck:  Supple, normal ROM; thyroid: no enlargement/tenderness/nodules; no carotid bruit or JVD.   Back:  Spine nontender, no curvature, no CVA tenderness. No SI joint tenderness. No muscle spasm.  Lungs:  Clear to auscultation bilaterally without wheezes, rales or ronchi; respirations unlabored   Chest Wall:  No tenderness or deformity   Heart:  Regular rate and rhythm, S1 and S2 normal, no murmur, rub or gallop   Breast Exam:  No tenderness, masses, or nipple discharge or inversion. No axillary lymphadenopathy  Abdomen:  Soft, non-tender, nondistended, normoactive bowel sounds, no masses, no hepatosplenomegaly   Genitalia:  Normal external genitalia without lesions. BUS and vagina normal; no cervical motion tenderness or cervical lesions.  Uterus and adnexa not enlarged, nontender, no masses. Pap  performed   Rectal:  Normal sphincter tone, no masses. Heme negative stool.  Swollen, nontender external hemorrhoid noted  Extremities:  No clubbing, cyanosis or edema. .  Pulses:  2+ and symmetric all extremities   Skin:  Skin color, texture, turgor normal, no rashes or lesions. Many tattoos. +cherry angiomas.  +actinic changes and freckles to skin on upper back; no suspicious lesions  Lymph nodes:  Cervical, supraclavicular, and axillary nodes normal.   Neurologic:  CNII-XII intact, normal strength, sensation and gait; reflexes 2+in lower extremities.  Psych:  Normal mood, affect, hygiene and grooming   ASSESSMENT/PLAN:  Annual physical exam - Plan: POCT Urinalysis DIP (Proadvantage Device), Cytology - PAP([place]), Comprehensive metabolic panel, CBC with Differential/Platelet  Medication monitoring encounter  Low back pain without sciatica, unspecified back pain laterality, unspecified chronicity - does well with chiro, prn NSAID, muscle relaxant, CBD oil  Tension headache  Raynaud's phenomenon without gangrene  Excessive drinking of alcohol - encouraged to cut back to 1/day; risks of ETOH reviewed    Discussed monthly self breast exams and yearly mammograms; at least 30 minutes of aerobic activity at least 5 days/week, weight bearing exercise 2xwk; proper sunscreen use reviewed; healthy diet, including goals of calcium and vitamin D intake and alcohol recommendations (less than or equal to 1 drink/day) reviewed; regular seatbelt use; changing batteries in smoke detectors. Immunization recommendations discussed-- continue flu shots annually.Recommended Shingrix (schedule [place] if covered/affordable).Colonoscopy recommendations reviewed, UTD, repeat 3 years (04/2020) per Dr. Lorie Apley recommendation.  Decrease alcohol to 1/day, risks reviewed

## 2018-06-11 ENCOUNTER — Ambulatory Visit (INDEPENDENT_AMBULATORY_CARE_PROVIDER_SITE_OTHER): Payer: Managed Care, Other (non HMO) | Admitting: Family Medicine

## 2018-06-11 ENCOUNTER — Encounter: Payer: Self-pay | Admitting: Family Medicine

## 2018-06-11 ENCOUNTER — Other Ambulatory Visit (HOSPITAL_COMMUNITY)
Admission: RE | Admit: 2018-06-11 | Discharge: 2018-06-11 | Disposition: A | Discharge status: Home / Self Care | Payer: Managed Care, Other (non HMO) | Source: Ambulatory Visit | Attending: Family Medicine | Admitting: Family Medicine

## 2018-06-11 VITALS — BP 122/76 | HR 72 | Temp 97.0°F | Ht 67.0 in | Wt 152.2 lb

## 2018-06-11 DIAGNOSIS — M545 Low back pain, unspecified: Secondary | ICD-10-CM

## 2018-06-11 DIAGNOSIS — Z0000 Encounter for general adult medical examination without abnormal findings: Secondary | ICD-10-CM

## 2018-06-11 DIAGNOSIS — G44209 Tension-type headache, unspecified, not intractable: Secondary | ICD-10-CM | POA: Diagnosis not present

## 2018-06-11 DIAGNOSIS — Z5181 Encounter for therapeutic drug level monitoring: Secondary | ICD-10-CM

## 2018-06-11 DIAGNOSIS — F1010 Alcohol abuse, uncomplicated: Secondary | ICD-10-CM

## 2018-06-11 DIAGNOSIS — I7300 Raynaud's syndrome without gangrene: Secondary | ICD-10-CM

## 2018-06-11 LAB — POCT URINALYSIS DIP (PROADVANTAGE DEVICE)
Bilirubin, UA: NEGATIVE
Glucose, UA: NEGATIVE mg/dL
Ketones, POC UA: NEGATIVE mg/dL
LEUKOCYTES UA: NEGATIVE
NITRITE UA: NEGATIVE
PH UA: 6 (ref 5.0–8.0)
PROTEIN UA: NEGATIVE mg/dL
Specific Gravity, Urine: 1.01
UUROB: NEGATIVE

## 2018-06-11 LAB — RESULTS CONSOLE HPV: CHL HPV: NEGATIVE

## 2018-06-11 LAB — HM PAP SMEAR: HM Pap smear: NEGATIVE

## 2018-06-11 NOTE — Patient Instructions (Addendum)
  HEALTH MAINTENANCE RECOMMENDATIONS:  It is recommended that you get at least 30 minutes of aerobic exercise at least 5 days/week (for weight loss, you may need as much as 60-90 minutes). This can be any activity that gets your heart rate up. This can be divided in 10-15 minute intervals if needed, but try and build up your endurance at least once a week.  Weight bearing exercise is also recommended twice weekly.  Eat a healthy diet with lots of vegetables, fruits and fiber.  "Colorful" foods have a lot of vitamins (ie green vegetables, tomatoes, red peppers, etc).  Limit sweet tea, regular sodas and alcoholic beverages, all of which has a lot of calories and sugar.  Up to 1 alcoholic drink daily may be beneficial for women (unless trying to lose weight, watch sugars).  Drink a lot of water.  Calcium recommendations are 1200-1500 mg daily (1500 mg for postmenopausal women or women without ovaries), and vitamin D 1000 IU daily.  This should be obtained from diet and/or supplements (vitamins), and calcium should not be taken all at once, but in divided doses.  Monthly self breast exams and yearly mammograms for women over the age of 82 is recommended.  Sunscreen of at least SPF 30 should be used on all sun-exposed parts of the skin when outside between the hours of 10 am and 4 pm (not just when at beach or pool, but even with exercise, golf, tennis, and yard work!)  Use a sunscreen that says "broad spectrum" so it covers both UVA and UVB rays, and make sure to reapply every 1-2 hours.  Remember to change the batteries in your smoke detectors when changing your clock times in the spring and fall.  Use your seat belt every time you are in a car, and please drive safely and not be distracted with cell phones and texting while driving.  I recommend getting the new shingles vaccine (Shingrix). You will need to check with your insurance to see if it is covered, and if covered, schedule a nurse visit at our  office.  It is a series of 2 injections, spaced 2 months apart.

## 2018-06-12 LAB — COMPREHENSIVE METABOLIC PANEL
ALK PHOS: 56 IU/L (ref 39–117)
ALT: 28 IU/L (ref 0–32)
AST: 20 IU/L (ref 0–40)
Albumin/Globulin Ratio: 1.9 (ref 1.2–2.2)
Albumin: 4.7 g/dL (ref 3.5–5.5)
BUN/Creatinine Ratio: 16 (ref 9–23)
BUN: 10 mg/dL (ref 6–24)
Bilirubin Total: 0.5 mg/dL (ref 0.0–1.2)
CALCIUM: 10 mg/dL (ref 8.7–10.2)
CO2: 24 mmol/L (ref 20–29)
CREATININE: 0.62 mg/dL (ref 0.57–1.00)
Chloride: 97 mmol/L (ref 96–106)
GFR calc Af Amer: 115 mL/min/{1.73_m2} (ref >59)
GFR, EST NON AFRICAN AMERICAN: 100 mL/min/{1.73_m2} (ref >59)
Globulin, Total: 2.5 g/dL (ref 1.5–4.5)
Glucose: 97 mg/dL (ref 65–99)
POTASSIUM: 4 mmol/L (ref 3.5–5.2)
Sodium: 139 mmol/L (ref 134–144)
Total Protein: 7.2 g/dL (ref 6.0–8.5)

## 2018-06-12 LAB — CBC WITH DIFFERENTIAL/PLATELET
Basophils Absolute: 0.1 10*3/uL (ref 0.0–0.2)
Basos: 1 %
EOS (ABSOLUTE): 0 10*3/uL (ref 0.0–0.4)
Eos: 0 %
Hematocrit: 37.7 % (ref 34.0–46.6)
Hemoglobin: 13.1 g/dL (ref 11.1–15.9)
Immature Grans (Abs): 0 10*3/uL (ref 0.0–0.1)
Immature Granulocytes: 0 %
Lymphocytes Absolute: 1.8 10*3/uL (ref 0.7–3.1)
Lymphs: 25 %
MCH: 32.8 pg (ref 26.6–33.0)
MCHC: 34.7 g/dL (ref 31.5–35.7)
MCV: 94 fL (ref 79–97)
Monocytes Absolute: 0.6 10*3/uL (ref 0.1–0.9)
Monocytes: 8 %
Neutrophils Absolute: 4.5 10*3/uL (ref 1.4–7.0)
Neutrophils: 66 %
Platelets: 279 10*3/uL (ref 150–450)
RBC: 4 x10E6/uL (ref 3.77–5.28)
RDW: 12 % — ABNORMAL LOW (ref 12.3–15.4)
WBC: 7 10*3/uL (ref 3.4–10.8)

## 2018-06-12 NOTE — Telephone Encounter (Signed)
Not sure if there is any way to abstract the lipids? Thanks

## 2018-06-16 LAB — CYTOLOGY - PAP
Diagnosis: NEGATIVE
HPV: NOT DETECTED

## 2018-09-30 ENCOUNTER — Telehealth: Payer: Managed Care, Other (non HMO) | Admitting: Family

## 2018-09-30 DIAGNOSIS — J302 Other seasonal allergic rhinitis: Secondary | ICD-10-CM

## 2018-09-30 MED ORDER — LEVOCETIRIZINE DIHYDROCHLORIDE 5 MG PO TABS: 5.0000 mg | ORAL_TABLET | Freq: Every evening | ORAL | 0 refills | Status: DC

## 2018-09-30 NOTE — Progress Notes (Signed)
E visit for Allergic Rhinitis We are sorry that you are not feeling well.  Here is how we plan to help!  Based on what you have shared with me it looks like you have Allergic Rhinitis.  Rhinitis is when a reaction occurs that causes nasal congestion, runny nose, sneezing, and itching.  Most types of rhinitis are caused by an inflammation and are associated with symptoms in the eyes ears or throat. There are several types of rhinitis.  The most common are acute rhinitis, which is usually caused by a viral illness, allergic or seasonal rhinitis, and nonallergic or year-round rhinitis.  Nasal allergies occur certain times of the year.  Allergic rhinitis is caused when allergens in the air trigger the release of histamine in the body.  Histamine causes itching, swelling, and fluid to build up in the fragile linings of the nasal passages, sinuses and eyelids.  An itchy nose and clear discharge are common.  I recommend the following over the counter treatments: Xyzal 5 mg take 1 tablet daily   You may also benefit from eye drops such as: Systane 1-2 driops each eye twice daily as needed  HOME CARE:   You can use an over-the-counter saline nasal spray as needed  Avoid areas where there is heavy dust, mites, or molds  Stay indoors on windy days during the pollen season  Keep windows closed in home, at least in bedroom; use air conditioner.  Use high-efficiency house air filter  Keep windows closed in car, turn AC on re-circulate  Avoid playing out with dog during pollen season  GET HELP RIGHT AWAY IF:   If your symptoms do not improve within 10 days  You become short of breath  You develop yellow or green discharge from your nose for over 3 days  You have coughing fits  MAKE SURE YOU:   Understand these instructions  Will watch your condition  Will get help right away if you are not doing well or get worse  Thank you for choosing an e-visit. Your e-visit answers were  reviewed by a board certified advanced clinical practitioner to complete your personal care plan. Depending upon the condition, your plan could have included both over the counter or prescription medications. Please review your pharmacy choice. Be sure that the pharmacy you have chosen is open so that you can pick up your prescription now.  If there is a problem you may message your provider in [place] to have the prescription routed to another pharmacy. Your safety is important to [place]. If you have drug allergies check your prescription carefully.  For the next 24 hours, you can use MyChart to ask questions about today's visit, request a non-urgent call back, or ask for a work or school excuse from your e-visit provider. You will get an email in the next two days asking about your experience. I hope that your e-visit has been valuable and will speed your recovery.      Greater than 5 minutes, yet less than 10 minutes of time have been spent researching, coordinating, and implementing care for this patient today.  Thank you for the details you included in the comment boxes. Those details are very helpful in determining the best course of treatment for you and help [place] to provide the best care.

## 2018-11-04 ENCOUNTER — Encounter: Payer: Self-pay | Admitting: Family Medicine

## 2018-11-04 ENCOUNTER — Ambulatory Visit (INDEPENDENT_AMBULATORY_CARE_PROVIDER_SITE_OTHER): Payer: Managed Care, Other (non HMO) | Admitting: Family Medicine

## 2018-11-04 ENCOUNTER — Other Ambulatory Visit: Payer: Self-pay

## 2018-11-04 VITALS — BP 130/80 | HR 61 | Temp 96.4°F | Wt 154.8 lb

## 2018-11-04 DIAGNOSIS — J3089 Other allergic rhinitis: Secondary | ICD-10-CM | POA: Diagnosis not present

## 2018-11-04 DIAGNOSIS — Z20828 Contact with and (suspected) exposure to other viral communicable diseases: Secondary | ICD-10-CM

## 2018-11-04 DIAGNOSIS — Z8619 Personal history of other infectious and parasitic diseases: Secondary | ICD-10-CM | POA: Diagnosis not present

## 2018-11-04 DIAGNOSIS — Z20822 Contact with and (suspected) exposure to covid-19: Secondary | ICD-10-CM

## 2018-11-04 DIAGNOSIS — L299 Pruritus, unspecified: Secondary | ICD-10-CM

## 2018-11-04 MED ORDER — PREDNISONE 10 MG (21) PO TBPK: ORAL_TABLET | Freq: Every day | ORAL | 0 refills | Status: DC

## 2018-11-04 NOTE — Progress Notes (Signed)
   Subjective:    Patient ID: Shannon [name], female    DOB: 08/18/1960, 58 y.o.   MRN: [id number]  HPI Chief Complaint  Patient presents with  . face swelling    face swelling x 45 days. eyes swelling worse in morning, crusty, no redness. itchy all over body.    Complains of a 45 day history of dry skin across her forehead, around her eyes as well as occasional swelling of her eyelids.  States her eyes drain regularly, clear fluid and nothing purulent. No matting of eyelashes ever.  She also reports sneezing often and post nasal drainage.  States she itches all over at times.  No rash on her body at present.   States she has not discussed this with her PCP in the past because her annual exams are always in the winter and this occurs every spring.  States she has had allergies her whole life but symptoms are much worse this year.   States she tried Claritin in the past and switched to Xyzal for the past 35 days. She has not noticed any improvement. Denies being on Singular in the past or having allergy shots.   States recent exercise made itching much worse. She had a rash along her sports bra area when she finished.   States she has seen Dr. Allyson Sabal in the past.   Requests antibody test for Covid- 19. States she was in [place] in March for a convention and then when she returned she was ill. She thinks she may have had the virus.   Denies fever, chills, fatigue, unexplained weight loss, headache, dizziness, chest pain, palpitations, shortness of breath, abdominal pain, N/V/D, urinary symptoms, LE edema.  No myalgias or arthralgias.   Reviewed allergies, medications, past medical, surgical, family, and social history.   Review of Systems Pertinent positives and negatives in the history of present illness.     Objective:   Physical Exam BP 130/80   Pulse 61   Temp (!) 96.4 F (35.8 C) (Oral)   Wt 154 lb 12.8 oz (70.2 kg)   LMP 11/25/1997   SpO2 97%   BMI 24.25 kg/m    Alert and in no distress. Bilateral upper eye lids with patches of dry skin. No edema or drainage. normal conjunctiva. Tympanic membranes and canals are normal. Neck is supple without adenopathy or thyromegaly. Cardiac exam shows a regular rhythm without murmurs or gallops. Lungs are clear to auscultation. Skin is warm and dry. No rash observed.       Assessment & Plan:  Pruritus - Plan: CBC with Differential/Platelet, Comprehensive metabolic panel, Ambulatory referral to Allergy, predniSONE (STERAPRED UNI-PAK 21 TAB) 10 MG (21) TBPK tablet  Environmental and seasonal allergies - Plan: Ambulatory referral to Allergy  History of viral illness - Plan: SAR CoV2 Serology (COVID 19)AB(IGG)IA  Exposure to Covid-19 Virus - Plan: SAR CoV2 Serology (COVID 19)AB(IGG)IA  Check labs to look for underlying etiology for pruritis.  She is requesting medication to help with itching right away since this is interfering with her happiness and comfort level. I will prescribe a course of steroids.  Referral to allergist.  She is aware that she will need to be off of all allergy medication prior to her appointment.  Screen for Covid-19 antibodies per request. She was in a hot spot in March and then ill shortly after. Fully recovered.  Follow up pending labs.

## 2018-11-05 LAB — COMPREHENSIVE METABOLIC PANEL
ALT: 29 IU/L (ref 0–32)
AST: 25 IU/L (ref 0–40)
Albumin/Globulin Ratio: 2 (ref 1.2–2.2)
Albumin: 4.7 g/dL (ref 3.8–4.9)
Alkaline Phosphatase: 66 IU/L (ref 39–117)
BUN/Creatinine Ratio: 22 (ref 9–23)
BUN: 13 mg/dL (ref 6–24)
Bilirubin Total: 0.5 mg/dL (ref 0.0–1.2)
CO2: 25 mmol/L (ref 20–29)
Calcium: 9.6 mg/dL (ref 8.7–10.2)
Chloride: 99 mmol/L (ref 96–106)
Creatinine, Ser: 0.58 mg/dL (ref 0.57–1.00)
GFR calc Af Amer: 117 mL/min/{1.73_m2} (ref >59)
GFR calc non Af Amer: 101 mL/min/{1.73_m2} (ref >59)
Globulin, Total: 2.4 g/dL (ref 1.5–4.5)
Glucose: 97 mg/dL (ref 65–99)
Potassium: 4.6 mmol/L (ref 3.5–5.2)
Sodium: 139 mmol/L (ref 134–144)
Total Protein: 7.1 g/dL (ref 6.0–8.5)

## 2018-11-05 LAB — CBC WITH DIFFERENTIAL/PLATELET
Basophils Absolute: 0 10*3/uL (ref 0.0–0.2)
Basos: 1 %
EOS (ABSOLUTE): 0.1 10*3/uL (ref 0.0–0.4)
Eos: 1 %
Hematocrit: 37.1 % (ref 34.0–46.6)
Hemoglobin: 13.2 g/dL (ref 11.1–15.9)
Immature Grans (Abs): 0 10*3/uL (ref 0.0–0.1)
Immature Granulocytes: 0 %
Lymphocytes Absolute: 1.3 10*3/uL (ref 0.7–3.1)
Lymphs: 27 %
MCH: 33.8 pg — ABNORMAL HIGH (ref 26.6–33.0)
MCHC: 35.6 g/dL (ref 31.5–35.7)
MCV: 95 fL (ref 79–97)
Monocytes Absolute: 0.6 10*3/uL (ref 0.1–0.9)
Monocytes: 12 %
Neutrophils Absolute: 2.8 10*3/uL (ref 1.4–7.0)
Neutrophils: 59 %
Platelets: 251 10*3/uL (ref 150–450)
RBC: 3.91 x10E6/uL (ref 3.77–5.28)
RDW: 12.7 % (ref 11.7–15.4)
WBC: 4.8 10*3/uL (ref 3.4–10.8)

## 2018-11-05 LAB — SAR COV2 SEROLOGY (COVID19)AB(IGG),IA: SARS-CoV-2 Ab, IgG: NEGATIVE

## 2018-11-16 ENCOUNTER — Ambulatory Visit: Payer: Managed Care, Other (non HMO) | Admitting: Allergy and Immunology

## 2019-01-08 ENCOUNTER — Other Ambulatory Visit: Payer: Self-pay

## 2019-01-08 ENCOUNTER — Ambulatory Visit (INDEPENDENT_AMBULATORY_CARE_PROVIDER_SITE_OTHER): Payer: Managed Care, Other (non HMO) | Admitting: Medical

## 2019-01-08 VITALS — BP 120/80 | HR 69 | Temp 97.7°F | Resp 16 | Wt 154.4 lb

## 2019-01-08 DIAGNOSIS — M549 Dorsalgia, unspecified: Secondary | ICD-10-CM

## 2019-01-08 DIAGNOSIS — B029 Zoster without complications: Secondary | ICD-10-CM | POA: Diagnosis not present

## 2019-01-08 LAB — POCT URINALYSIS DIP (PROADVANTAGE DEVICE)
Bilirubin, UA: NEGATIVE
Glucose, UA: NEGATIVE mg/dL
Ketones, POC UA: NEGATIVE mg/dL
Leukocytes, UA: NEGATIVE
Nitrite, UA: NEGATIVE
Protein Ur, POC: NEGATIVE mg/dL
Specific Gravity, Urine: 1
Urobilinogen, Ur: NEGATIVE
pH, UA: 7.5 (ref 5.0–8.0)

## 2019-01-08 MED ORDER — IBUPROFEN 600 MG PO TABS: 600.0000 mg | ORAL_TABLET | Freq: Three times a day (TID) | ORAL | 0 refills | Status: DC | PRN

## 2019-01-08 MED ORDER — VALACYCLOVIR HCL 1 G PO TABS: 1000.0000 mg | ORAL_TABLET | Freq: Three times a day (TID) | ORAL | 0 refills | Status: DC

## 2019-01-08 NOTE — Patient Instructions (Signed)
Shingles  Shingles, which is also known as herpes zoster, is an infection that causes a painful skin rash and fluid-filled blisters. It is caused by a virus. Shingles only develops in people who:  Have had chickenpox.  Have been given a medicine to protect against chickenpox (have been vaccinated). Shingles is rare in this group. What are the causes? Shingles is caused by varicella-zoster virus (VZV). This is the same virus that causes chickenpox. After a person is exposed to VZV, the virus stays in the body in an inactive (dormant) state. Shingles develops if the virus is reactivated. This can happen many years after the first (initial) exposure to VZV. It is not known what causes this virus to be reactivated. What increases the risk? People who have had chickenpox or received the chickenpox vaccine are at risk for shingles. Shingles infection is more common in people who:  Are older than age 60.  Have a weakened disease-fighting system (immune system), such as people with: ? HIV. ? AIDS. ? Cancer.  Are taking medicines that weaken the immune system, such as transplant medicines.  Are experiencing a lot of stress. What are the signs or symptoms? Early symptoms of this condition include itching, tingling, and pain in an area on your skin. Pain may be described as burning, stabbing, or throbbing. A few days or weeks after early symptoms start, a painful red rash appears. The rash is usually on one side of the body and has a band-like or belt-like pattern. The rash eventually turns into fluid-filled blisters that break open, change into scabs, and dry up in about 2-3 weeks. At any time during the infection, you may also develop:  A fever.  Chills.  A headache.  An upset stomach. How is this diagnosed? This condition is diagnosed with a skin exam. Skin or fluid samples may be taken from the blisters before a diagnosis is made. These samples are examined under a microscope or sent to  a lab for testing. How is this treated? The rash may last for several weeks. There is not a specific cure for this condition. Your health care provider will probably prescribe medicines to help you manage pain, recover more quickly, and avoid long-term problems. Medicines may include:  Antiviral drugs.  Anti-inflammatory drugs.  Pain medicines.  Anti-itching medicines (antihistamines). If the area involved is on your face, you may be referred to a specialist, such as an eye doctor (ophthalmologist) or an ear, nose, and throat (ENT) doctor (otolaryngologist) to help you avoid eye problems, chronic pain, or disability. Follow these instructions at home: Medicines  Take over-the-counter and prescription medicines only as told by your health care provider.  Apply an anti-itch cream or numbing cream to the affected area as told by your health care provider. Relieving itching and discomfort   Apply cold, wet cloths (cold compresses) to the area of the rash or blisters as told by your health care provider.  Cool baths can be soothing. Try adding baking soda or dry oatmeal to the water to reduce itching. Do not bathe in hot water. Blister and rash care  Keep your rash covered with a loose bandage (dressing). Wear loose-fitting clothing to help ease the pain of material rubbing against the rash.  Keep your rash and blisters clean by washing the area with mild soap and cool water as told by your health care provider.  Check your rash every day for signs of infection. Check for: ? More redness, swelling, or pain. ? Fluid   or blood. ? Warmth. ? Pus or a bad smell.  Do not scratch your rash or pick at your blisters. To help avoid scratching: ? Keep your fingernails clean and cut short. ? Wear gloves or mittens while you sleep, if scratching is a problem. General instructions  Rest as told by your health care provider.  Keep all follow-up visits as told by your health care provider. This  is important.  Wash your hands often with soap and water. If soap and water are not available, use hand sanitizer. Doing this lowers your chance of getting a bacterial skin infection.  Before your blisters change into scabs, your shingles infection can cause chickenpox in people who have never had it or have never been vaccinated against it. To prevent this from happening, avoid contact with other people, especially: ? Babies. ? Pregnant women. ? Children who have eczema. ? Elderly people who have transplants. ? People who have chronic illnesses, such as cancer or AIDS. Contact a health care provider if:  Your pain is not relieved with prescribed medicines.  Your pain does not get better after the rash heals.  You have signs of infection in the rash area, such as: ? More redness, swelling, or pain around the rash. ? Fluid or blood coming from the rash. ? The rash area feeling warm to the touch. ? Pus or a bad smell coming from the rash. Get help right away if:  The rash is on your face or nose.  You have facial pain, pain around your eye area, or loss of feeling on one side of your face.  You have difficulty seeing.  You have ear pain or have ringing in your ear.  You have a loss of taste.  Your condition gets worse. Summary  Shingles, which is also known as herpes zoster, is an infection that causes a painful skin rash and fluid-filled blisters.  This condition is diagnosed with a skin exam. Skin or fluid samples may be taken from the blisters and examined before the diagnosis is made.  Keep your rash covered with a loose bandage (dressing). Wear loose-fitting clothing to help ease the pain of material rubbing against the rash.  Before your blisters change into scabs, your shingles infection can cause chickenpox in people who have never had it or have never been vaccinated against it. This information is not intended to replace advice given to you by your health care  provider. Make sure you discuss any questions you have with your health care provider. Document Released: 05/27/2005 Document Revised: 09/18/2018 Document Reviewed: 01/29/2017 Elsevier Patient Education  2020 [employer]  

## 2019-01-08 NOTE — Progress Notes (Signed)
Subjective: Chief Complaint  Patient presents with  . shingles    shingles, rash X 1 day   Here today for possible shingles.  Yesterday she had a general discomfort along her left lower chest wall and abdomen and a somewhat linear area that wraps around from the front to the side of her abdomen and chest, that at times is quite significant pain.  Then today she developed a small area of rash and a cluster of redness.  Now she is wondering about shingles.  She has never had shingles but had chickenpox as a child.  She denies fever, body aches, chills, nausea, vomiting, no leg pains no numbness or tingling.  No recent illness.  She does have a reason amount of stress at work.  She notes some urinary discomfort from time to time but no frank urinary tract symptoms.  No other symptoms otherwise normal state of health.  Past Medical History:  Diagnosis Date  . Benign hematuria    negative workup in past (in [place])  . Cerebral AVM    Dr. Christella Noa  . GERD (gastroesophageal reflux disease)   . IBS (irritable bowel syndrome)    Current Outpatient Medications on File Prior to Visit  Medication Sig Dispense Refill  . b complex vitamins tablet Take 1 tablet by mouth daily.    Marland Kitchen CALCIUM CITRATE PO Take 1 tablet by mouth 2 (two) times daily.      . cetirizine (ZYRTEC) 10 MG tablet Take 10 mg by mouth daily.    . Cholecalciferol (VITAMIN D) 1000 UNITS capsule Take 1,000 Units by mouth daily.      . famotidine (PEPCID) 10 MG tablet Take 10 mg by mouth daily.      . Multiple Vitamins-Minerals (MULTIVITAMIN WITH MINERALS) tablet Take 1 tablet by mouth daily.      . Omega-3 Fatty Acids (FISH OIL) 1000 MG CAPS Take 1 capsule by mouth 2 (two) times daily.     . TURMERIC PO Take 1 tablet by mouth daily.    . vitamin C (ASCORBIC ACID) 500 MG tablet Take 500 mg by mouth 2 (two) times daily.      Marland Kitchen levocetirizine (XYZAL) 5 MG tablet Take 1 tablet (5 mg total) by mouth every evening. (Patient not taking: Reported on  01/08/2019) 30 tablet 0  . methocarbamol (ROBAXIN) 500 MG tablet Take 1-2 tablets (500-1,000 mg total) by mouth every 6 (six) hours as needed for muscle spasms. (Patient not taking: Reported on 06/11/2018) 60 tablet 0   No current facility-administered medications on file prior to visit.    ROS as in subjective  Objective: BP 120/80   Pulse 69   Temp 97.7 F (36.5 C) (Temporal)   Resp 16   Wt 154 lb 6.4 oz (70 kg)   LMP 11/25/1997   SpO2 98%   BMI 24.18 kg/m   Gen: wd,wn, nad Skin: Left lower anterior lateral lower chest wall with a small 3 cm roundish patch of erythema with several small erythematous lesions within the patch suggestive of possible early papules but no frank pustules no other rash Mild tenderness in general over the left anterior lateral chest wall and abdomen No back tenderness    Assessment: Encounter Diagnoses  Name Primary?  . Herpes zoster without complication Yes  . Other acute back pain      Plan Discussed the symptoms, exam findings, suggestive of shingles outbreak.  discussed transmission, precautions to prevent spread to others, particularly high risk groups as discussed  including young children, elderly, immunocompromised people, or pregnant women.  Discussed treatment including relative rest, hydration, can use OTC Cortaid topically but don't let others use the cream and discard the cream after this episode of shingles.   Begin Valtrex as below, can use NSAID OTC, and can use Norco for worse pain sparingly.   discussed the possibility of post herpetic neuralgia.   answered their questions, After visit summary given.   Ja was seen today for shingles.  Diagnoses and all orders for this visit:  Herpes zoster without complication  Other acute back pain -     POCT Urinalysis DIP (Proadvantage Device)  Other orders -     valACYclovir (VALTREX) 1000 MG tablet; Take 1 tablet (1,000 mg total) by mouth 3 (three) times daily. -     ibuprofen (ADVIL)  600 MG tablet; Take 1 tablet (600 mg total) by mouth every 8 (eight) hours as needed.

## 2019-05-28 ENCOUNTER — Encounter: Payer: Self-pay | Admitting: Family Medicine

## 2019-06-15 LAB — HM MAMMOGRAPHY

## 2019-06-16 NOTE — Patient Instructions (Addendum)
HEALTH MAINTENANCE RECOMMENDATIONS:  It is recommended that you get at least 30 minutes of aerobic exercise at least 5 days/week (for weight loss, you may need as much as 60-90 minutes). This can be any activity that gets your heart rate up. This can be divided in 10-15 minute intervals if needed, but try and build up your endurance at least once a week.  Weight bearing exercise is also recommended twice weekly.  Eat a healthy diet with lots of vegetables, fruits and fiber.  "Colorful" foods have a lot of vitamins (ie green vegetables, tomatoes, red peppers, etc).  Limit sweet tea, regular sodas and alcoholic beverages, all of which has a lot of calories and sugar.  Up to 1 alcoholic drink daily may be beneficial for women (unless trying to lose weight, watch sugars).  Drink a lot of water.  Calcium recommendations are 1200-1500 mg daily (1500 mg for postmenopausal women or women without ovaries), and vitamin D 1000 IU daily.  This should be obtained from diet and/or supplements (vitamins), and calcium should not be taken all at once, but in divided doses.  Monthly self breast exams and yearly mammograms for women over the age of 14 is recommended.  Sunscreen of at least SPF 30 should be used on all sun-exposed parts of the skin when outside between the hours of 10 am and 4 pm (not just when at beach or pool, but even with exercise, golf, tennis, and yard work!)  Use a sunscreen that says "broad spectrum" so it covers both UVA and UVB rays, and make sure to reapply every 1-2 hours.  Remember to change the batteries in your smoke detectors when changing your clock times in the spring and fall. Carbon monoxide detectors are recommended for your home.  Use your seat belt every time you are in a car, and please drive safely and not be distracted with cell phones and texting while driving.   COVID vaccine is recommended when available.  (follow the news, or [place] [facility] website, or  [email or web address][email or web address])  Shingrix vaccine was given today.  You will need the second dose in 2 months.  Take the meloxicam once daily for 10 days (you can complete all 15 if needed).  Keep me updated if your symptoms don't resolve.   Cubital Tunnel Syndrome  Cubital tunnel syndrome is a condition that causes pain and weakness of the forearm and hand. It happens when one of the nerves that runs along the inside of the elbow joint (ulnar nerve) becomes irritated. This condition is usually caused by repeated arm motions that are done during sports or work-related activities. What are the causes? This condition may be caused by:  Increased pressure on the ulnar nerve at the elbow, arm, or forearm. This can result from: ? Irritation caused by repeated elbow bending. ? Poorly healed elbow fractures. ? Tumors in the elbow. These are usually noncancerous (benign). ? Scar tissue that develops in the elbow after an injury. ? Bony growths (spurs) near the ulnar nerve.  Stretching of the nerve due to loose elbow ligaments.  Trauma to the nerve at the elbow. What increases the risk? The following factors may make you more likely to develop this condition:  Doing manual labor that requires frequent bending of the elbow.  Playing sports that include repeated or strenuous throwing motions, such as baseball.  Playing contact sports, such as football or lacrosse.  Not warming up properly before activities.  Having diabetes.  Having an underactive thyroid (hypothyroidism).  What are the signs or symptoms? Symptoms of this condition include:  Clumsiness or weakness of the hand.  Tenderness of the inner elbow.  Aching or soreness of the inner elbow, forearm, or fingers, especially the little finger or the ring finger.  Increased pain when forcing the elbow to bend.  Reduced control when throwing objects.  Tingling, numbness, or a burning feeling inside the forearm or in part of the hand or  fingers, especially the little finger or the ring finger.  Sharp pains that shoot from the elbow down to the wrist and hand.  The inability to grip or pinch hard. How is this diagnosed? This condition is diagnosed based on:  Your symptoms and medical history. Your health care provider will also ask for details about any injury.  A physical exam. You may also have tests, including:  Electromyogram (EMG). This test measures electrical signals sent by your nerves into the muscles.  Nerve conduction study. This test measures how well electrical signals pass through your nerves.  Imaging tests, such as X-rays, ultrasound, and MRI. These tests check for possible causes of your condition. How is this treated? This condition may be treated by:  Stopping the activities that are causing your symptoms to get worse.  Icing and taking medicines to reduce pain and swelling.  Wearing a splint to prevent your elbow from bending, or wearing an elbow pad where the ulnar nerve is closest to the skin.  Working with a physical therapist in less severe cases. This may help to: ? Decrease your symptoms. ? Improve the strength and range of motion of your elbow, forearm, and hand. If these treatments do not help, surgery may be needed. Follow these instructions at home: If you have a splint:  Wear the splint as told by your health care provider. Remove it only as told by your health care provider.  Loosen the splint if your fingers tingle, become numb, or turn cold and blue.  Keep the splint clean.  If the splint is not waterproof: ? Do not let it get wet. ? Cover it with a watertight covering when you take a bath or shower. Managing pain, stiffness, and swelling   If directed, put ice on the injured area: ? Put ice in a plastic bag. ? Place a towel between your skin and the bag. ? Leave the ice on for 20 minutes, 2-3 times a day.  Move your fingers often to avoid stiffness and to lessen  swelling.  Raise (elevate) the injured area above the level of your heart while you are sitting or lying down. General instructions  Take over-the-counter and prescription medicines only as told by your health care provider.  Do any exercise or physical therapy as told by your health care provider.  Do not drive or use heavy machinery while taking prescription pain medicine.  If you were given an elbow pad, wear it as told by your health care provider.  Keep all follow-up visits as told by your health care provider. This is important. Contact a health care provider if:  Your symptoms get worse.  Your symptoms do not get better with treatment.  You have new pain.  Your hand on the injured side feels numb or cold. Summary  Cubital tunnel syndrome is a condition that causes pain and weakness of the forearm and hand.  You are more likely to develop this condition if you do work or play sports that involve repeated arm movements.  This condition is  often treated by stopping repetitive activities, applying ice, and using anti-inflammatory medicines.  In rare cases, surgery may be needed. This information is not intended to replace advice given to you by your health care provider. Make sure you discuss any questions you have with your health care provider. Document Revised: 10/13/2017 Document Reviewed: 10/13/2017 Elsevier Patient Education  [place].

## 2019-06-16 NOTE — Progress Notes (Signed)
Chief Complaint  Patient presents with  . Annual Exam    fasting annual exam with pelvic. Would like to speak with you about hand and wrist pain.     Shannon [name] is a 59 y.o. female who presents for a complete physical.  She has the following concerns:  She is complaining of numbness in her hands and sporadic pain in right wrist that radiates to elbow.  She has h/o neck fusion, was concerned it could be from her neck and was requesting MRI. (see MyChart message). Numbness in R hand is intermittent.  (L hand numbness is chronic, since prior surgery), right is new.  Numbness on the right hand was in the 4th and 5th fingers.  Doesn't have any tenderness at her elbow, which makes her think it could be from her neck. No neck or R shoulder pain.  Does have some intermittent stabbing pain on the LEFT. Pain across the R wrist--sometimes at base of thumb, sometimes across the wrist. Pain is usually when working on the computer.  She is working from home.  She has been trying to be more aware of ergodynamics.  H/o wrist pain in the past, used to have to use a special mouse. Pain sometimes radiates up the ulnar side of the forearm to the medial portion of elbow. When thumb hurts it sometimes radiates to the anterior part of the wrist. She does some wrist stretches that have helped with the pain to the elbow.  She saw Audelia Acton for shingles on left chest wall in July. It was a mild course, and she has no residual discomfort.  She saw Vickie in May with rash on forehead, swelling in eyelids, and pruritis.  She was given steroid course and referred to allergist. She was changed from Claritin to Zyrtec and was prescribed hydroxyzine, as well as astelin and optivar.  She wasn't able to tolerate hydroxyzine due to sedation (took only 1).  Eye drops and other medications are very helpful. She had skin tests done and reports that she is allergic to dust mites and horses.  Raynaud's previously was a few  times/week in the hands, much less since she doesn't need to commute to work (mainly occurred while driving).  No longer noticing anything in her toes.  Stabbing, "ice pick" headaches in the area above the right ear, intermittently for many years. Much less often now than in previous years.  Has just slight tenderness where she used to get the headaches.  She has known bruxism, wears a night guard. Used to go to the chiropractor regularly, much less frequently due to [place].  Has gone just a few times.  She has robaxin to use prn, rarely needs (last filled #60 in 01/2018, still has a few left).  Uses the muscle relaxant more for her back than for tension headaches.  She has somechronicback pain. She previously took meloxicam, but switched to ibuprofen.  She has been doing well using ibuprofen prn, along with Turmeric and topical CBD oil. This has been managing her pain.  She periodically has SI issues (currently more on the left than the right).  Alcohol--still 3 drinks daily.  "This is not the year" to cut down.  Immunization History  Administered Date(s) Administered  . Influenza Split 03/08/2014, 03/11/2019  . Influenza,inj,Quad PF,6+ Mos 03/15/2013, 03/29/2016  . Influenza-Unspecified 04/04/2015, 03/24/2017  . Td 02/21/2005  . Tdap 03/31/2014   Gets yearly flu shots at the pharmacy Last Pap smear: 06/2018, normal, with no high  risk HPV  Last mammogram: This week, normal (at [facility]) Last colonoscopy:05/09/17, +polyps (2 adenomatous, 1 hyperplastic). 3 year f/u recommended (Dr. Collene Mares) Last DEXA: 03/19/07, normal  Dentist: every 6 months  Ophtho: goes yearly Exercise:Walks on treadmill 220 minutes/week (15-20 minute intervals 3x/day) Right wrist pain has limited her weight training x 5 months. Lipids: Lab Results  Component Value Date   CHOL 209 (A) 09/17/2017   HDL 78 (A) 09/17/2017   LDLCALC 118 09/17/2017   TRIG 65 09/17/2017   CHOLHDL 1.6 05/11/2015   Vitamin D-OH 57 in  05/2016    PMH, PSH, SH and FH reviewed and updated  Outpatient Encounter Medications as of 06/17/2019  Medication Sig Note  . azelastine (ASTELIN) 0.1 % nasal spray Place 2 sprays into both nostrils 2 (two) times daily.   Marland Kitchen azelastine (OPTIVAR) 0.05 % ophthalmic solution Place 1 drop into both eyes daily.    Marland Kitchen b complex vitamins tablet Take 1 tablet by mouth daily.   Marland Kitchen CALCIUM CITRATE PO Take 1 tablet by mouth 2 (two) times daily.     . cetirizine (ZYRTEC) 10 MG tablet Take 10 mg by mouth daily.   . Cholecalciferol (VITAMIN D) 1000 UNITS capsule Take 1,000 Units by mouth daily.     . famotidine (PEPCID) 10 MG tablet Take 10 mg by mouth daily.   06/11/2018: Unsure of dose, may be 46m  . Multiple Vitamins-Minerals (MULTIVITAMIN WITH MINERALS) tablet Take 1 tablet by mouth daily.     . Omega-3 Fatty Acids (FISH OIL) 1000 MG CAPS Take 1 capsule by mouth 2 (two) times daily.    . TURMERIC PO Take 1 tablet by mouth daily.   . vitamin C (ASCORBIC ACID) 500 MG tablet Take 500 mg by mouth 2 (two) times daily.     .Marland Kitchenibuprofen (ADVIL) 600 MG tablet Take 1 tablet (600 mg total) by mouth every 8 (eight) hours as needed. (Patient not taking: Reported on 06/17/2019)   . methocarbamol (ROBAXIN) 500 MG tablet Take 1-2 tablets (500-1,000 mg total) by mouth every 6 (six) hours as needed for muscle spasms. (Patient not taking: Reported on 06/11/2018) 06/11/2018: Uses prn, mostly for back pain  . [DISCONTINUED] levocetirizine (XYZAL) 5 MG tablet Take 1 tablet (5 mg total) by mouth every evening. (Patient not taking: Reported on 01/08/2019)   . [DISCONTINUED] valACYclovir (VALTREX) 1000 MG tablet Take 1 tablet (1,000 mg total) by mouth 3 (three) times daily.    No facility-administered encounter medications on file as of 06/17/2019.   Allergies  Allergen Reactions  . Lanolin Rash  . Latex Rash    ROS: The patient denies anorexia, fever, weight changes, vision changes, decreased hearing, ear pain, sore throat, breast  concerns, chest pain, palpitations, dizziness, syncope, dyspnea on exertion, cough, swelling, nausea, vomiting, diarrhea, constipation, abdominal pain, melena, hematochezia, incontinence, dysuria, postmenopausal bleeding, vaginal discharge, odor or itch, genital lesions, weakness, tremor, suspicious skin lesions, depression, anxiety, abnormal bleeding/bruising, or enlarged lymph nodes. Reflux is controlled with Pepcid. Denies any abdominal pain or dysphagia. H/o microscopic hematuria--no visible hematuria.  Denies hot flashes, night sweats. No longer getting leg cramps since increased water intake. Stools are unchanged--loose in the mornings(after coffee).No hemorrhoidal bleeding Back pain per HPI, periodically flares Headaches and raynaud's per HPI, not as often as last year. R hand numbness and pain to elbow per HPI  PHYSICAL EXAM:  BP 118/70   Pulse 68   Temp 97.7 F (36.5 C) (Other (Comment))   Ht '5\' 8"'  (1.727  m)   Wt 157 lb 6.4 oz (71.4 kg)   LMP 11/25/1997   BMI 23.93 kg/m    Wt Readings from Last 3 Encounters:  06/17/19 157 lb 6.4 oz (71.4 kg)  01/08/19 154 lb 6.4 oz (70 kg)  11/04/18 154 lb 12.8 oz (70.2 kg)    General Appearance:  Alert, cooperative, no distress, appears stated age   Head:  Normocephalic, without obvious abnormality, atraumatic   Eyes:  PERRL, conjunctiva/corneas clear, EOM's intact, fundi benign   Ears:  Normal TM's and external ear canals   Nose:  Not examined, wearing mask due to COVID-19 pandemic  Throat:  Not examined, wearing mask due to COVID-19 pandemic  Neck:  Supple, normal ROM; thyroid: no enlargement/tenderness/nodules; no carotid bruit or JVD.   Back:  Spine nontender, no curvature, no CVA tenderness. No SI joint tenderness. No muscle spasm.  Lungs:  Clear to auscultation bilaterally without wheezes, rales or ronchi; respirations unlabored   Chest Wall:  No tenderness or deformity   Heart:  Regular rate and rhythm,  S1 and S2 normal, no murmur, rub or gallop   Breast Exam:  No tenderness, masses, or nipple discharge or inversion. No axillary lymphadenopathy  Abdomen:  Soft, non-tender, nondistended, normoactive bowel sounds, no masses, no hepatosplenomegaly   Genitalia:  Normal external genitalia without lesions. BUS and vagina normal; no cervical motion tenderness. Uterus and adnexa not enlarged, nontender, no masses. Pap not performed   Rectal:  Normal sphincter tone, no masses. Heme negative stool. External hemorrhoid, nontender  Extremities:  No clubbing, cyanosis or edema..Negative Finklestein test and Tinel. She is tender over the R ulnar nerve at elbow, which triggered tingling down the forearm. Normal strength, no discomfort or pain with supination/pronation, wrist flexion/extension, thumb extension.  Pulses:  2+ and symmetric all extremities   Skin:  Skin color, texture, turgor normal, no rashes or lesions. Many tattoos. +cherry angiomas.  +actinic changes and freckles to skin on upper back; no suspicious lesions  Lymph nodes:  Cervical, supraclavicular, and axillary nodes normal.   Neurologic:  CNII-XII intact, normal strength, sensation and gait; reflexes 2+in lower extremities, 3+ in LUE, 2+ RUE  Psych: Normal mood, affect, hygiene and grooming   ASSESSMENT/PLAN:  Annual physical exam  Ulnar neuropathy at elbow of right upper extremity - Trial of NSAIDs (risks/SE reviewed), avoid resting elbow on hard surfaces - Plan: meloxicam (MOBIC) 15 MG tablet  Environmental and seasonal allergies - well controlled on current regimen, started by allergist  Raynaud's phenomenon without gangrene - less frequent  Low back pain without sciatica, unspecified back pain laterality, unspecified chronicity - may benefit from course of NSAID as well. Interested in [place] study if candidate. Encouraged core strengthening, stretches  Need for shingles vaccine -  risks/benefits/SE reviewed.  return for [place] in 2 months for second dose - Plan: Varicella-zoster vaccine IM (Shingrix)   Shingrix #1 given [employer] study for LBP  Had c-met, CBC in 10/2018. Normal lipids 09/2017 (repeat if diet change) Repeat labs next year, none needed today   Discussed monthly self breast exams and yearly mammograms; at least 30 minutes of aerobic activity at least 5 days/week, weight bearing exercise 2xwk; proper sunscreen use reviewed; healthy diet, including goals of calcium and vitamin D intake and alcohol recommendations (less than or equal to 1 drink/day) reviewed; regular seatbelt use; changing batteries in smoke detectors. Immunization recommendations discussed-- continue flu shots annually.Shingrix recommended, first dose given today (risks/SE reviewed), return in 2 mos for second.  Colonoscopy recommendations reviewed,UTD, repeat 3 years (04/2020) per Dr. Lorie Apley recommendation.  Decrease alcohol to 1/day, risks reviewed in detail  F/u 1 year, sooner prn NV 2 mos for 2nd shingrix

## 2019-06-17 ENCOUNTER — Ambulatory Visit (INDEPENDENT_AMBULATORY_CARE_PROVIDER_SITE_OTHER): Payer: 59 | Admitting: Family Medicine

## 2019-06-17 ENCOUNTER — Encounter: Payer: Self-pay | Admitting: *Deleted

## 2019-06-17 ENCOUNTER — Other Ambulatory Visit: Payer: Self-pay

## 2019-06-17 ENCOUNTER — Encounter: Payer: Self-pay | Admitting: Family Medicine

## 2019-06-17 VITALS — BP 118/70 | HR 68 | Temp 97.7°F | Ht 68.0 in | Wt 157.4 lb

## 2019-06-17 DIAGNOSIS — I7300 Raynaud's syndrome without gangrene: Secondary | ICD-10-CM

## 2019-06-17 DIAGNOSIS — G5621 Lesion of ulnar nerve, right upper limb: Secondary | ICD-10-CM | POA: Diagnosis not present

## 2019-06-17 DIAGNOSIS — M545 Low back pain, unspecified: Secondary | ICD-10-CM

## 2019-06-17 DIAGNOSIS — J3089 Other allergic rhinitis: Secondary | ICD-10-CM

## 2019-06-17 DIAGNOSIS — Z0000 Encounter for general adult medical examination without abnormal findings: Secondary | ICD-10-CM

## 2019-06-17 DIAGNOSIS — Z23 Encounter for immunization: Secondary | ICD-10-CM | POA: Diagnosis not present

## 2019-06-17 MED ORDER — MELOXICAM 15 MG PO TABS: 15.0000 mg | ORAL_TABLET | Freq: Every day | ORAL | 0 refills | Status: DC

## 2019-06-18 ENCOUNTER — Encounter: Payer: Self-pay | Admitting: Family Medicine

## 2019-08-19 ENCOUNTER — Other Ambulatory Visit (INDEPENDENT_AMBULATORY_CARE_PROVIDER_SITE_OTHER): Payer: 59

## 2019-08-19 ENCOUNTER — Other Ambulatory Visit: Payer: Self-pay

## 2019-08-19 DIAGNOSIS — Z23 Encounter for immunization: Secondary | ICD-10-CM

## 2019-10-07 ENCOUNTER — Ambulatory Visit (INDEPENDENT_AMBULATORY_CARE_PROVIDER_SITE_OTHER): Payer: 59 | Admitting: Family Medicine

## 2019-10-07 ENCOUNTER — Encounter: Payer: Self-pay | Admitting: Family Medicine

## 2019-10-07 ENCOUNTER — Other Ambulatory Visit: Payer: Self-pay

## 2019-10-07 VITALS — BP 124/76 | HR 80 | Temp 97.5°F | Ht 68.0 in | Wt 153.5 lb

## 2019-10-07 DIAGNOSIS — R200 Anesthesia of skin: Secondary | ICD-10-CM

## 2019-10-07 DIAGNOSIS — G5621 Lesion of ulnar nerve, right upper limb: Secondary | ICD-10-CM

## 2019-10-07 MED ORDER — MELOXICAM 15 MG PO TABS: 15.0000 mg | ORAL_TABLET | Freq: Every day | ORAL | 0 refills | Status: DC

## 2019-10-07 NOTE — Progress Notes (Signed)
Chief Complaint  Patient presents with  . Numbness    in hands and fingers. Worsening, more often. The other day her right hand curled up in to a clench and then a few days her left hand did the same thing.    She found different stretches for ulnar neuropathy, as diagnosed at her physical, and has been doing them.  She took the mobic, and did get relief for a while. She presents today as she is concerned that the numbness is getting worse, and that she has noticed some weakness--has noted difficulty opening things, sometimes dropping things. Notices the numbness more now, because she is driving into the office. Across the back of the hand is numb when she gets to work.  Also concerned because a couple of weeks ago, one night she had a lot of tenseness and tightness in her R forearm, she turned her arm a certain way, and her R hand drew up into a clench.  Had to pry her fingers apart, and stretch them out.  A few days later that also happened on her left.  Denies being dehydrated, through recalls that it was on a weekend, so likely had been out doing yardwork.  Pain at wrist is much less often/sporadic.  Prior notes reviewed from physical--at that time she had more discomfort in her wrist, forearm, and was tender at the elbow--pressing there caused numbness/tingling to shoot into the hand.  PMH, PSH, SH reviewed.  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 azelastine (ASTELIN) 0.1 % nasal spray Place 2 sprays into both nostrils 2 (two) times daily.   Marland Kitchen azelastine (OPTIVAR) 0.05 % ophthalmic solution Place 1 drop into both eyes daily.    Marland Kitchen b complex vitamins tablet Take 1 tablet by mouth daily.   Marland Kitchen CALCIUM CITRATE PO Take 1 tablet by mouth 2 (two) times daily.     . cetirizine (ZYRTEC) 10 MG tablet Take 10 mg by mouth daily.   . Cholecalciferol (VITAMIN D) 1000 UNITS capsule Take 1,000  Units by mouth daily.     . famotidine (PEPCID) 20 MG tablet Take 20 mg by mouth 2 (two) times daily.   Marland Kitchen ibuprofen (ADVIL) 600 MG tablet Take 1 tablet (600 mg total) by mouth every 8 (eight) hours as needed. 06/17/2019: Uses OTC, usually just 400mg , up to 600mg  at a time  . Multiple Vitamins-Minerals (MULTIVITAMIN WITH MINERALS) tablet Take 1 tablet by mouth daily.     . Omega-3 Fatty Acids (FISH OIL) 1000 MG CAPS Take 1 capsule by mouth 2 (two) times daily.    . TURMERIC PO Take 1 tablet by mouth daily.   . vitamin C (ASCORBIC ACID) 500 MG tablet Take 500 mg by mouth 2 (two) times daily.     . [DISCONTINUED] famotidine (PEPCID) 10 MG tablet Take 10 mg by mouth daily.   06/11/2018: Unsure of dose, may be 20mg   . meloxicam (MOBIC) 15 MG tablet Take 1 tablet (15 mg total) by mouth daily. (Patient not taking: Reported on 10/07/2019)   . methocarbamol (ROBAXIN) 500 MG tablet Take 1-2 tablets (500-1,000 mg total) by mouth every 6 (six) hours as needed for muscle spasms. (Patient not taking: Reported on 06/11/2018) 06/11/2018: Uses prn, mostly for back pain   No facility-administered encounter medications on file as of 10/07/2019.   Allergies  Allergen Reactions  . Lanolin Rash  . Latex  Rash   ROS: no fever, chills, headaches, URI symptoms, chest pain, shortness of breath, GI complaints.  Numbness in UE's per HPI, with some weakness.  Denies pain at elbow; intermittent wrist pain. No other concerns except as noted in HPI.  PHYSICAL EXAM:  BP 124/76   Pulse 80   Temp (!) 97.5 F (36.4 C) (Other (Comment))   Ht 5\' 8"  (1.727 m)   Wt 153 lb 8 oz (69.6 kg)   LMP 11/25/1997   BMI 23.34 kg/m   Pleasant, well-appearing female in no distress HEENT: conjunctiva and sclera are clear, EOMI Neck: nontender Neuro:  Slightly weaker with pincer grasp on R 3rd and 4th fingers, not pulling through entirely, but noticeably different than other fingers. No tenderness at elbow No pain with  pronation/supination/wrist flexion/extension against resistance Wrist--FROM no pain Negative Tinel, Phalen's  ASSESSMENT/PLAN:  Hand numbness - suspect multifactorial. Poss CTS vs ulnar vs from neck. Trial repeat Mobic course, wrist brace. EMG/NCV if not improving - Plan: meloxicam (MOBIC) 15 MG tablet  Ulnar neuropathy at elbow of right upper extremity - Seems to have improved--no longer tender or reproducible discomfort  Continue your stretches. Use a carpal tunnel wrist brace during the day (including at work, driving, yardwork, etc).  No need to wear to sleep if not numb/painful at night. Take the meloxicam for up to another 2 weeks. If your discomfort and numbness and weakness have not significantly improved, the next step is nerve conduction studies, and I will refer you to neurology for these. Be in touch with [place] if you aren't improved, for the referral.

## 2019-10-07 NOTE — Patient Instructions (Signed)
  Continue your stretches. Use a carpal tunnel wrist brace during the day (including at work, driving, yardwork, etc).  No need to wear to sleep if not numb/painful at night. Take the meloxicam for up to another 2 weeks. If your discomfort and numbness and weakness have not significantly improved, the next step is nerve conduction studies, and I will refer you to neurology for these. Be in touch with [place] if you aren't improved, for the referral.   Take the meloxicam with food. No ibuprofen while taking meloxicam.

## 2019-11-11 ENCOUNTER — Other Ambulatory Visit: Payer: Self-pay | Admitting: *Deleted

## 2019-11-11 ENCOUNTER — Encounter: Payer: Self-pay | Admitting: Family Medicine

## 2019-11-11 DIAGNOSIS — G5621 Lesion of ulnar nerve, right upper limb: Secondary | ICD-10-CM

## 2019-11-11 DIAGNOSIS — R29898 Other symptoms and signs involving the musculoskeletal system: Secondary | ICD-10-CM

## 2019-12-16 ENCOUNTER — Other Ambulatory Visit: Payer: Self-pay

## 2019-12-16 ENCOUNTER — Encounter (INDEPENDENT_AMBULATORY_CARE_PROVIDER_SITE_OTHER): Payer: 59 | Admitting: Diagnostic Neuroimaging

## 2019-12-16 ENCOUNTER — Ambulatory Visit (INDEPENDENT_AMBULATORY_CARE_PROVIDER_SITE_OTHER): Payer: 59 | Admitting: Diagnostic Neuroimaging

## 2019-12-16 DIAGNOSIS — Z0289 Encounter for other administrative examinations: Secondary | ICD-10-CM

## 2019-12-16 DIAGNOSIS — R200 Anesthesia of skin: Secondary | ICD-10-CM

## 2019-12-16 DIAGNOSIS — R202 Paresthesia of skin: Secondary | ICD-10-CM | POA: Diagnosis not present

## 2019-12-16 NOTE — Procedures (Signed)
[facility]  NCS (NERVE CONDUCTION STUDY) WITH EMG (ELECTROMYOGRAPHY) REPORT   STUDY DATE: 12/16/19 PATIENT NAME: Shannon [name] DOB: 08-Nov-1960 MRN: [id number]  ORDERING CLINICIAN: Kelby Aline, MD  TECHNOLOGIST: Sherre Scarlet ELECTROMYOGRAPHER: Earlean Polka. Angello Chien, MD  CLINICAL INFORMATION: 59 year old female with bilateral hand / arm numbness.  History of cervical spine surgery.   FINDINGS: NERVE CONDUCTION STUDY:  Bilateral median and ulnar motor responses are normal.  Bilateral median and ulnar sensory responses are normal.  Bilateral median to ulnar transcarpal mixed nerve comparisons are normal.  Bilateral ulnar F-wave latencies are normal.   NEEDLE ELECTROMYOGRAPHY:  Needle examination of right upper company is normal.  IMPRESSION:   This is a normal study.  No electrodiagnostic evidence of large fiber neuropathy at this time.    INTERPRETING PHYSICIAN:  Penni Bombard, MD Certified in Neurology, Neurophysiology and Neuroimaging  [facility] [address] [place], [place] [address] [phone]   [facility]    Nerve / Sites Muscle Latency Ref. Amplitude Ref. Rel Amp Segments Distance Velocity Ref. Area    ms ms mV mV %  cm m/s m/s mVms  R Median - APB     Wrist APB 3.8 ?4.4 6.6 ?4.0 100 Wrist - APB 7   19.8     Upper arm APB 8.2  5.2  78.6 Upper arm - Wrist 22 50 ?49 15.8  L Median - APB     Wrist APB 3.1 ?4.4 9.2 ?4.0 100 Wrist - APB 7   32.4     Upper arm APB 7.2  8.0  87.6 Upper arm - Wrist 21 51 ?49 26.1  R Ulnar - ADM     Wrist ADM 2.4 ?3.3 13.5 ?6.0 100 Wrist - ADM 7   38.5     B.Elbow ADM 5.7  10.9  80.6 B.Elbow - Wrist 18 55 ?49 39.0     A.Elbow ADM 7.4  11.1  102 A.Elbow - B.Elbow 10 60 ?49 33.7  L Ulnar - ADM     Wrist ADM 2.7 ?3.3 13.5 ?6.0 100 Wrist - ADM 7   36.1     B.Elbow ADM 5.5  8.8  64.9 B.Elbow - Wrist 18 64 ?49 27.9     A.Elbow ADM 7.1  8.3  95 A.Elbow - B.Elbow 10 66 ?49 29.1               SNC    Nerve / Sites Rec. Site Peak Lat Ref.  Amp Ref. Segments Distance Peak Diff Ref.    ms ms V V  cm ms ms  R Median, Ulnar - Transcarpal comparison     Median Palm Wrist 2.4 ?2.2 125 ?35 Median Palm - Wrist 8       Ulnar Palm Wrist 2.0 ?2.2 36 ?12 Ulnar Palm - Wrist 8          Median Palm - Ulnar Palm  0.4 ?0.4  L Median, Ulnar - Transcarpal comparison     Median Palm Wrist 2.0 ?2.2 122 ?35 Median Palm - Wrist 8       Ulnar Palm Wrist 2.1 ?2.2 53 ?12 Ulnar Palm - Wrist 8          Median Palm - Ulnar Palm  -0.1 ?0.4  R Median - Orthodromic (Dig II, Mid palm)     Dig II Wrist 3.4 ?3.4 16 ?10 Dig II - Wrist 13    L Median - Orthodromic (Dig II, Mid  palm)     Dig II Wrist 2.9 ?3.4 14 ?10 Dig II - Wrist 13    R Ulnar - Orthodromic, (Dig V, Mid palm)     Dig V Wrist 2.8 ?3.1 12 ?5 Dig V - Wrist 11    L Ulnar - Orthodromic, (Dig V, Mid palm)     Dig V Wrist 2.9 ?3.1 13 ?5 Dig V - Wrist 54                   F  Wave    Nerve F Lat Ref.   ms ms  R Ulnar - ADM 26.7 ?32.0  L Ulnar - ADM 27.7 ?32.0         EMG Summary Table    Spontaneous MUAP Recruitment  Muscle IA Fib PSW Fasc Other Amp Dur. Poly Pattern  R. Deltoid Normal None None None _______ Normal Normal Normal Normal  R. Biceps brachii Normal None None None _______ Normal Normal Normal Normal  R. Triceps brachii Normal None None None _______ Normal Normal Normal Normal  R. Flexor carpi radialis Normal None None None _______ Normal Normal Normal Normal  R. First dorsal interosseous Normal None None None _______ Normal Normal Normal Normal

## 2019-12-17 ENCOUNTER — Encounter: Payer: Self-pay | Admitting: Family Medicine

## 2019-12-20 ENCOUNTER — Encounter: Payer: Self-pay | Admitting: Family Medicine

## 2019-12-20 ENCOUNTER — Ambulatory Visit (INDEPENDENT_AMBULATORY_CARE_PROVIDER_SITE_OTHER): Payer: 59 | Admitting: Family Medicine

## 2019-12-20 ENCOUNTER — Other Ambulatory Visit: Payer: Self-pay

## 2019-12-20 VITALS — BP 128/70 | HR 83 | Temp 96.8°F | Wt 158.4 lb

## 2019-12-20 DIAGNOSIS — M25531 Pain in right wrist: Secondary | ICD-10-CM | POA: Diagnosis not present

## 2019-12-20 DIAGNOSIS — M79601 Pain in right arm: Secondary | ICD-10-CM

## 2019-12-20 DIAGNOSIS — R200 Anesthesia of skin: Secondary | ICD-10-CM

## 2019-12-20 DIAGNOSIS — Z5181 Encounter for therapeutic drug level monitoring: Secondary | ICD-10-CM

## 2019-12-20 MED ORDER — MELOXICAM 15 MG PO TABS: 15.0000 mg | ORAL_TABLET | Freq: Every day | ORAL | 0 refills | Status: DC

## 2019-12-20 NOTE — Patient Instructions (Signed)
Take meloxicam once daily with food for up to 2 weeks. If you have no improvement in your pain and numbness, let me know and we will refer you to sports medicine. I sent in #[phone] so you have more if needed for a little longer, or to take in the future if you have recurrence (especially after more active projects).  I do feel like there is still a small component of carpal tunnel syndrome, so I could continue to use your wrist brace with activity (and sleep, if needed)

## 2019-12-20 NOTE — Progress Notes (Signed)
Chief Complaint  Patient presents with  . other    follow up on hand numbeness , tingling and pain . Both hands but more so the right    Patient presents with ongoing pain and numbness/tingling in bilateral upper extremities. She recently had EMG/NCV which were normal.  She reports that she uses her RUE a lot, has been very active, is R hand dominant. She is currently having pain at right medial elbow, more on the muscle than on the bone, just above the elbow. Also has pain at the R wrist, along the back of the wrist. Denies significant wrist swelling.  Sometimes has pain in the first web space of the R hand, and between the 4th and 5th metacarpals. Currently only has some discomfort at the back of the R wrist.  She states she had tingling and numbness R 3rd finger while in waiting room. Currently, in exam room reports some numbness of the right first and second fingers. With driving she gets tingling in the R 4th and 5th fingers (doesn't rest elbow on anything while driving).  She has been wearing a carpal tunnel brace, which seems to help with tingling at night.  Left arm--Recently she started getting a "nerve pain" at the upper arm, stabbing pain, just medial to biceps. It has occurred on/off for several days. Doesn't have pain currently.  This past week she has been staining her deck (a couple of hours at a time), not wearing her wrist brace at the time. She had to stop several times due to the numbness. 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   At her visit on 4/29 she had mentioned noticing some weakness, dropping things.  Not noticing any difference, only occurs infrequently. When wearing the brace, feels more supported, feels stronger.  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 azelastine (ASTELIN) 0.1 % nasal spray Place 2 sprays into both nostrils 2 (two) times daily.   Marland Kitchen azelastine (OPTIVAR) 0.05 % ophthalmic solution Place 1 drop into both eyes daily.    Marland Kitchen b complex vitamins tablet Take 1 tablet by mouth daily.   Marland Kitchen CALCIUM CITRATE PO Take 1 tablet by mouth 2 (two) times daily.     . cetirizine (ZYRTEC) 10 MG tablet Take 10 mg by mouth daily.   . Cholecalciferol (VITAMIN D) 1000 UNITS capsule Take 1,000 Units by mouth daily.     . famotidine (PEPCID) 20 MG tablet Take 20 mg by mouth 2 (two) times daily.   Marland Kitchen ibuprofen (ADVIL) 600 MG tablet Take 1 tablet (600 mg total) by mouth every 8 (eight) hours as needed. 12/20/2019: Takes 466m once in the morning  . methocarbamol (ROBAXIN) 500 MG tablet Take 1-2 tablets (500-1,000 mg total) by mouth every 6 (six) hours as needed for muscle spasms. 12/20/2019: Uses 1/2 tablet prn, a few times in the last week, helps  . Multiple Vitamins-Minerals (MULTIVITAMIN WITH MINERALS) tablet Take 1 tablet by mouth daily.     . Omega-3 Fatty Acids (FISH OIL) 1000 MG CAPS Take 1 capsule by mouth 2 (two) times daily.    . TURMERIC PO Take 1 tablet by mouth daily.   .Marland Kitchen  vitamin C (ASCORBIC ACID) 500 MG tablet Take 500 mg by mouth 2 (two) times daily.     . meloxicam (MOBIC) 15 MG tablet Take 1 tablet (15 mg total) by mouth daily. (Patient not taking: Reported on 12/20/2019)    No facility-administered encounter medications on file as of 12/20/2019.   Allergies  Allergen Reactions  . Lanolin Rash  . Latex Rash   ROS:  No fever, chills, URI symptoms, headaches, dizziness, chest pain, GI complaints.  No neck pain, neurologic symptoms other than the tingling in hands per HPI.  No nausea, vomiting, bowel changes, bleeding, bruising, rash   PHYSICAL EXAM:  BP 128/70   Pulse 83   Temp (!) 96.8 F (36 C)   Wt 158 lb 6.4 oz (71.8 kg)    LMP 11/25/1997   SpO2 97%   BMI 24.08 kg/m   Wt Readings from Last 3 Encounters:  12/20/19 158 lb 6.4 oz (71.8 kg)  10/07/19 153 lb 8 oz (69.6 kg)  06/17/19 157 lb 6.4 oz (71.4 kg)   Well-appearing, pleasant female, in no distress RUE:  She is mildly tender at superior aspect of right medial epicondyle, but more tender spot is located in the muscle proximal to this, at lower portion of R medial upper arm. No palpable mass or spasm. No pain with pronation/supination/flexion/extension against resistance.  Normal strength. Normal strength of pincer grasp with all fingers.  +tinel--pullling sensation and numbness over the thumb/palm, and index finger Wrist has FROM, no focal tenderness, swelling, warmth.  LUE:  Normal strength, nontender. Negative Phalen, tinel.   ASSESSMENT/PLAN:  Hand numbness - normal EMG/NCV, but exam consistent with mild CTS. repeat Meloxicam, cont wrist brace. Consider sports med eval for RUE pain - Plan: Comprehensive metabolic panel, TSH, Vitamin B12, meloxicam (MOBIC) 15 MG tablet  Medication monitoring encounter - Plan: Comprehensive metabolic panel, Vitamin O77  Right wrist pain - component of overuse, poss CTS, poss mild arthritis. cont wrist brace, course of mobic  Right arm pain - seems to be a small focal muscle, medially in the upper arm. Suspect related to overuse/tendonitis. Course of mobic. Sports med referral if not better  c-met, TSH, B12    Explained that given the normal EMG/NCV findings, her RUE pain do not represent radiculopathy. More likely, represent an overuse injury, especially given recent onset of the pain. The numbness/tingling in the hands are more longstanding. Surprising that there wasn't evidence of CTS on NCV, given her exam today. Will treat for CTS and muscular pain with mobic and brace. Will refer to sports med for further evaluation if not improving.  Pt understands and agrees with plans.  35+ min visit, plus time spent  reviewing chart, results, and documentation. A lot of time spent answering questions.

## 2019-12-21 LAB — COMPREHENSIVE METABOLIC PANEL
ALT: 24 IU/L (ref 0–32)
AST: 21 IU/L (ref 0–40)
Albumin/Globulin Ratio: 2 (ref 1.2–2.2)
Albumin: 4.7 g/dL (ref 3.8–4.9)
Alkaline Phosphatase: 63 IU/L (ref 48–121)
BUN/Creatinine Ratio: 16 (ref 12–28)
BUN: 10 mg/dL (ref 8–27)
Bilirubin Total: 0.3 mg/dL (ref 0.0–1.2)
CO2: 24 mmol/L (ref 20–29)
Calcium: 9.8 mg/dL (ref 8.7–10.3)
Chloride: 98 mmol/L (ref 96–106)
Creatinine, Ser: 0.62 mg/dL (ref 0.57–1.00)
GFR calc Af Amer: 113 mL/min/{1.73_m2} (ref >59)
GFR calc non Af Amer: 98 mL/min/{1.73_m2} (ref >59)
Globulin, Total: 2.4 g/dL (ref 1.5–4.5)
Glucose: 91 mg/dL (ref 65–99)
Potassium: 4.5 mmol/L (ref 3.5–5.2)
Sodium: 136 mmol/L (ref 134–144)
Total Protein: 7.1 g/dL (ref 6.0–8.5)

## 2019-12-21 LAB — VITAMIN B12: Vitamin B-12: 819 pg/mL (ref 232–1245)

## 2019-12-21 LAB — TSH: TSH: 1.03 u[IU]/mL (ref 0.450–4.500)

## 2020-06-07 LAB — HM COLONOSCOPY

## 2020-06-22 ENCOUNTER — Encounter: Payer: 59 | Admitting: Family Medicine

## 2020-09-05 LAB — HM MAMMOGRAPHY

## 2020-09-09 ENCOUNTER — Encounter: Payer: Self-pay | Admitting: Family Medicine

## 2020-09-11 ENCOUNTER — Encounter: Payer: Self-pay | Admitting: *Deleted

## 2020-09-12 NOTE — Progress Notes (Signed)
Chief Complaint  Patient presents with  . Pain    Wrist pain/eczema. Eczema flare has been about 6 weeks.    Patient sent the following message: "I have been experiencing pain in my right hand over the last month or so, primarily in my wrist. Last year, when I experienced this, at some point I also developed a patch or two of eczema just below my wrist. That is back, along with a patch on my palm near my wrist. I really think it is arthritis and the eczema is related to it somehow.  Do you think it would be worth seeing a specialist like a rheumatologist? There is also family history.  Additionally, I had eczema as a young child for several years."  She has had ongoing issues with wrist pain--pain is at the dorsum of the right wrist, and pain radiates into the metacarpal bones (3rd and 4th).  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  She denies any redness, swelling, warmth at the wrist.  Has some mild chronic tingling in the R 4th and 5th fingers.  She also has chronic numbness in L hand from brain surgery. Last year she had a lot of pain in her elbows.  She changed sleep position, and R elbow is no longer painful, numbness has improved.  Doesn't rest elbow on anything while on computer.  Uses ibuprofen as needed, last took on Sunday.  Rash on R wrist recurred about a month or two ago, same time as last year (late winter). Last year it resolved on its own. H/o eczema as a child. Rash is itchy.  It started as a spot on the wrist/distal forearm, but is now spreading into the palm of the hand.  She has used 1% HC OTC cream once daily for 2-3 weeks (sometimes twice).  She has some concerns about arthritis related to her FH: Maternal uncle with RA. Mother sees rheum, has OA, had carpal tunnel issues, trigger fingers  PMH, PSH, SH reviewed  Outpatient Encounter Medications as of 09/13/2020  Medication Sig Note  . azelastine (ASTELIN) 0.1 % nasal spray Place 2  sprays into both nostrils 2 (two) times daily.   Marland Kitchen azelastine (OPTIVAR) 0.05 % ophthalmic solution Place 1 drop into both eyes daily.    Marland Kitchen b complex vitamins tablet Take 1 tablet by mouth daily.   Marland Kitchen Black Elderberry (SAMBUCUS ELDERBERRY PO) Take 1 capsule by mouth daily.   Marland Kitchen CALCIUM CITRATE PO Take 1 tablet by mouth 2 (two) times daily.   . cetirizine (ZYRTEC) 10 MG tablet Take 10 mg by mouth daily.   . Cholecalciferol (VITAMIN D) 1000 UNITS capsule Take 1,000 Units by mouth daily.   . famotidine (PEPCID) 20 MG tablet Take 20 mg by mouth 2 (two) times daily. 09/13/2020: Once daily  . Multiple Vitamins-Minerals (MULTIVITAMIN WITH MINERALS) tablet Take 1 tablet by mouth daily.   . Omega-3 Fatty Acids (FISH OIL) 1000 MG CAPS Take 1 capsule by mouth 2 (two) times daily.   . TURMERIC PO Take 2 tablets by mouth daily.   . vitamin C (ASCORBIC ACID) 500 MG tablet Take 500 mg by mouth 2 (two) times daily.   Marland Kitchen ibuprofen (ADVIL) 600 MG tablet Take 1 tablet (600 mg total) by mouth every 8 (eight) hours as needed. (Patient not taking: Reported on 09/13/2020) 09/13/2020: As needed   . methocarbamol (ROBAXIN) 500 MG tablet Take 1-2 tablets (500-1,000 mg total) by mouth every  6 (six) hours as needed for muscle spasms. (Patient not taking: Reported on 09/13/2020) 12/20/2019: Uses 1/2 tablet prn, a few times in the last week, helps  . [DISCONTINUED] meloxicam (MOBIC) 15 MG tablet Take 1 tablet (15 mg total) by mouth daily. (Patient not taking: Reported on 09/13/2020)    No facility-administered encounter medications on file as of 09/13/2020.   Allergies  Allergen Reactions  . Lanolin Rash  . Latex Rash   ROS:  No fever, chills, URI symptoms.  Numbness/tingling and wrist pain per HPI.  Rash per HPI.  No chest pain, shortness of breath. No bruising, bleeding. Moods are good. See HPI   PHYSICAL EXAM:  BP 118/70   Pulse 68   Ht '5\' 8"'  (1.727 m)   Wt 157 lb 12.8 oz (71.6 kg)   LMP 11/25/1997   BMI 23.99 kg/m    Well-appearing, pleasant female, in no distress R forearm: 2 x 1.5 cm patch of erythema at the right distal forearm, on ulnar aspect Some dry patch at proximal central palm as well. FROM of wrists. Normal strength and no pain with flexion/extension of wrists or fingers  +phalen on R, neg tinel Slight tenderness at carpal bones near base of 4th metacarpal HEENT: conjunctiva and sclera are clear, EOMI, wearing mask Neuro: alert and oriented, normal strength, sensation, normal gait.   ASSESSMENT/PLAN:  Right wrist pain - suspect overuse or tendonitis.  Do not suspect RA. Check xray, consider labs (RF, ESR or CRP) - Plan: DG Wrist Complete Right, meloxicam (MOBIC) 15 MG tablet  Eczema, unspecified type - TAC BID, moisturize well.  Reassured no relation to her pain - Plan: triamcinolone (KENALOG) 0.1 %   TAC 0.1%  xr R wrist meloxicam once daily for up to 2 weeks. Risks/SE reviewed in detail, not to mix with ibuprofen. Discussed labs (CRP or ESR with RF), prefers to wait until physical, if needed.  I spent 33 minutes dedicated to the care of this patient, including pre-visit review of records, face to face time, post-visit ordering of testing and documentation.    Moisturize at least 3 times daily (use creams or ointments/vaseline rather than lotions). Avoid alcohol based cleansers.  Wear gloves with cleaning/dishes. Use the steroid cream sparingly to the affected area of skin, twice daily until resolved (or up to 2 weeks)  Go to [place] for x-ray to get wrist x-ray.  Take meloxicam once daily with food until pain has completely resolved (or up to the full course of 15 days).  If pain recurs upon stopping, consider using Volteren gel (topical, is available over-the-counter) as a topical anti-inflammatory is safer to use more regularly.

## 2020-09-13 ENCOUNTER — Other Ambulatory Visit: Payer: Self-pay

## 2020-09-13 ENCOUNTER — Encounter: Payer: Self-pay | Admitting: Family Medicine

## 2020-09-13 ENCOUNTER — Ambulatory Visit (INDEPENDENT_AMBULATORY_CARE_PROVIDER_SITE_OTHER): Payer: 59 | Admitting: Family Medicine

## 2020-09-13 VITALS — BP 118/70 | HR 68 | Ht 68.0 in | Wt 157.8 lb

## 2020-09-13 DIAGNOSIS — M25531 Pain in right wrist: Secondary | ICD-10-CM | POA: Diagnosis not present

## 2020-09-13 DIAGNOSIS — L309 Dermatitis, unspecified: Secondary | ICD-10-CM

## 2020-09-13 MED ORDER — MELOXICAM 15 MG PO TABS: 15.0000 mg | ORAL_TABLET | Freq: Every day | ORAL | 0 refills | Status: DC

## 2020-09-13 MED ORDER — TRIAMCINOLONE ACETONIDE 0.1 % EX CREA: 1.0000 "application " | TOPICAL_CREAM | Freq: Two times a day (BID) | CUTANEOUS | 0 refills | Status: DC | PRN

## 2020-09-13 NOTE — Patient Instructions (Signed)
Moisturize at least 3 times daily (use creams or ointments/vaseline rather than lotions). Avoid alcohol based cleansers.  Wear gloves with cleaning/dishes. Use the steroid cream sparingly to the affected area of skin, twice daily until resolved (or up to 2 weeks)  Go to [place] for x-ray to get wrist x-ray.  Take meloxicam once daily with food until pain has completely resolved (or up to the full course of 15 days).  If pain recurs upon stopping, consider using Volteren gel (topical, is available over-the-counter) as a topical anti-inflammatory is safer to use more regularly.

## 2020-10-12 ENCOUNTER — Encounter: Payer: 59 | Admitting: Family Medicine

## 2020-10-18 NOTE — Progress Notes (Signed)
Chief Complaint  Patient presents with  . Annual Exam    Fasting annual exam with pelvic, sees eye doctor yearly. No new complaints.    Shannon [name] is a 60 y.o. female who presents for a complete physical.  She has the following concerns:  Seen recently with R wrist pain and eczema.  She was treated with TAC for the eczema, and meloxicam for the wrist pain.  X-ray was ordered, but not done. We discussed potentially doing arthritis labs, but she deferred labs until her physical. Meloxicam worked so well, she didn't get x-ray. Wrist pain is much better, able to do yardwork and regular activities without discomfort.  Just occasional pain.  She would still like the labs done, given her h/o Raynaud's, and FHx of RA.  TAC helped with the rash, not completely resolved, using it sporadically (used it today after not having used in 4-5 days).    Last year had a lot of complaints of numbness in her hands, and some R wrist pain.  EMG/NCV were normal. She is no longer having the pain and tingling as she did last year. Has some chronic very slight tingly sensation in the 3rd-5th knuckles on the right hand.    Raynaud's previously was a few times/week in the hands, much less since she doesn't need to commute to work (mainly occurred while driving).  She is barely commuting, still gets it when it is cold out, but very infrequent. No longer noticing anything in her toes.  Stabbing, "ice pick" headaches in the area above the right ear, intermittently for many years. Much less often now than in previous years.  Only gets headache about 2-3x/year, relieved by ibuprofen. She has known bruxism, wears a night guard.  Used to go to the chiropractor regularly, hasn't gone in over a year. She has robaxin to use prn, rarely needs (last filled #60 in 01/2018).  Uses the muscle relaxant more for her back than for tension headaches.  She has somechronicback pain. She has been doing well using ibuprofen prn, along  with Turmeric and topical CBD oil. This has been managing her pain.   She has been doing core strengthening 3-4x/week (program through work). She periodically has SI issues, but exercises/stretching helps.  Alcohol--still 3 drinks daily. Not interested at all in cutting down.  Immunization History  Administered Date(s) Administered  . Influenza Split 03/08/2014, 03/11/2019  . Influenza,inj,Quad PF,6+ Mos 03/15/2013, 03/29/2016, 03/28/2020  . Influenza-Unspecified 04/04/2015, 03/24/2017  . PFIZER(Purple Top)SARS-COV-2 Vaccination 09/04/2019, 09/25/2019, 03/28/2020, 09/25/2020  . Td 02/21/2005  . Tdap 03/31/2014  . Zoster Recombinat (Shingrix) 06/17/2019, 08/19/2019   Gets yearly flu shots at the pharmacy Last Pap smear: 06/2018, normal, with no high risk HPV  Last mammogram: 08/2020 Last colonoscopy:05/2020, internal hemorrhoids.  5 yr f/u recommended (Dr. Collene Mares).  Prior was 05/09/17, +polyps (2 adenomatous, 1 hyperplastic). Last DEXA: 03/19/07, normal  Dentist: every 6 months  Ophtho: goes yearly Exercise: Gets >150 minutes/week, walking on treadmill for 10-15 minute intervals.  Some 30-45 minute walks outside once a week in addition. No current weight-bearing exercise.  Lipids: Lab Results  Component Value Date   CHOL 209 (A) 09/17/2017   HDL 78 (A) 09/17/2017   LDLCALC 118 09/17/2017   TRIG 65 09/17/2017   CHOLHDL 1.6 05/11/2015   Vitamin D-OH 57 in 05/2016   PMH, PSH, SH and FH reviewed and updated  Outpatient Encounter Medications as of 10/19/2020  Medication Sig Note  . azelastine (ASTELIN) 0.1 % nasal  spray Place 2 sprays into both nostrils 2 (two) times daily.   Marland Kitchen azelastine (OPTIVAR) 0.05 % ophthalmic solution Place 1 drop into both eyes daily.    Marland Kitchen b complex vitamins tablet Take 1 tablet by mouth daily.   Marland Kitchen Black Elderberry (SAMBUCUS ELDERBERRY PO) Take 1 capsule by mouth daily.   Marland Kitchen CALCIUM CITRATE PO Take 1 tablet by mouth 2 (two) times daily.   . cetirizine  (ZYRTEC) 10 MG tablet Take 10 mg by mouth daily.   . Cholecalciferol (VITAMIN D) 1000 UNITS capsule Take 1,000 Units by mouth daily.   . famotidine (PEPCID) 20 MG tablet Take 20 mg by mouth 2 (two) times daily. 09/13/2020: Once daily  . Multiple Vitamins-Minerals (MULTIVITAMIN WITH MINERALS) tablet Take 1 tablet by mouth daily.   . Omega-3 Fatty Acids (FISH OIL) 1000 MG CAPS Take 1 capsule by mouth 2 (two) times daily.   Marland Kitchen triamcinolone (KENALOG) 0.1 % Apply 1 application topically 2 (two) times daily as needed (eczema/rash).   . TURMERIC PO Take 2 tablets by mouth daily.   . vitamin C (ASCORBIC ACID) 500 MG tablet Take 500 mg by mouth 2 (two) times daily.   Marland Kitchen ibuprofen (ADVIL) 600 MG tablet Take 1 tablet (600 mg total) by mouth every 8 (eight) hours as needed. (Patient not taking: No sig reported) 09/13/2020: As needed   . methocarbamol (ROBAXIN) 500 MG tablet Take 1-2 tablets (500-1,000 mg total) by mouth every 6 (six) hours as needed for muscle spasms. (Patient not taking: No sig reported) 10/19/2020: Uses prn for back pain, not needed in a long time  . [DISCONTINUED] meloxicam (MOBIC) 15 MG tablet Take 1 tablet (15 mg total) by mouth daily. Take until pain resolves    No facility-administered encounter medications on file as of 10/19/2020.   Allergies  Allergen Reactions  . Lanolin Rash  . Latex Rash   ROS: The patient denies anorexia, fever, weight changes, vision changes, decreased hearing, ear pain, sore throat, breast concerns, chest pain, palpitations, dizziness, syncope, dyspnea on exertion, cough, swelling, nausea, vomiting, diarrhea, constipation, abdominal pain, melena, hematochezia, incontinence, dysuria, postmenopausal bleeding, vaginal discharge, odor or itch, genital lesions, weakness, tremor, suspicious skin lesions, depression, anxiety, abnormal bleeding/bruising, or enlarged lymph nodes.  Reflux is controlled with Pepcid. Denies any abdominal pain or dysphagia. H/o microscopic  hematuria--no visible hematuria.  Denies hot flashes, night sweats. Stools are unchanged--loose in the mornings(after coffee).No hemorrhoidal bleeding Back pain periodically flares, not recent/mild Headaches and raynaud's per HPI, not as often as in the past Hand numbness--just very mild, improved R wrist pain resolved. +seasonal allergies, fairly well controlled. Rash R wrist--improving. L knee pain periodically (at top of the knee), no knee swelling    PHYSICAL EXAM:  BP 126/80   Pulse 72   Ht 5\' 7"  (1.702 m)   Wt 156 lb 12.8 oz (71.1 kg)   LMP 11/25/1997   BMI 24.56 kg/m   Wt Readings from Last 3 Encounters:  10/19/20 156 lb 12.8 oz (71.1 kg)  09/13/20 157 lb 12.8 oz (71.6 kg)  12/20/19 158 lb 6.4 oz (71.8 kg)    General Appearance:  Alert, cooperative, no distress, appears stated age   Head:  Normocephalic, without obvious abnormality, atraumatic   Eyes:  PERRL, conjunctiva/corneas clear, EOM's intact, fundi benign   Ears:  Normal TM's and external ear canals   Nose:  Not examined, wearing mask due to COVID-19 pandemic  Throat:  Not examined, wearing mask due to COVID-19 pandemic  Neck:  Supple, normal ROM; thyroid: no enlargement/tenderness/nodules; no carotid bruit or JVD.   Back:  Spine nontender, no curvature, no CVA tenderness. No SI joint tenderness. No muscle spasm.  Lungs:  Clear to auscultation bilaterally without wheezes, rales or ronchi; respirations unlabored   Chest Wall:  No tenderness or deformity   Heart:  Regular rate and rhythm, S1 and S2 normal, no murmur, rub or gallop   Breast Exam:  No tenderness, masses, or nipple discharge or inversion. No axillary lymphadenopathy  Abdomen:  Soft, non-tender, nondistended, normoactive bowel sounds, no masses, no hepatosplenomegaly   Genitalia:  Normal external genitalia without lesions. BUS and vagina normal; no cervical motion tenderness. Uterus and adnexa not enlarged, nontender, no  masses. Pap not performed   Rectal:  Normal sphincter tone, no masses. Heme negative stool. External hemorrhoid mildly inflamed, nontender  Extremities:  No clubbing, cyanosis or edema. No swelling/effusion at L knee.  Very mild tenderness at distal IT band on left, but area of her discomfort is superior to patella (nontender today, no swelling).  Pulses:  2+ and symmetric all extremities   Skin:  Skin color, texture, turgor normal, no lesions. Many tattoos. +cherry angiomas.  +actinic changes and freckles to skin on upper back; no suspicious lesions. Rash at ulnar aspect of distal R forearm (no longer has any rash on the palm)--erythematous, slightly raised. No longer rough/dry  Lymph nodes:  Cervical, supraclavicular, and axillary nodes normal.   Neurologic:  Normal strength, sensation and gait; reflexes 2+in lower extremities, 3+ in LUE, 2+ RUE  Psych: Normal mood, affect, hygiene and grooming    ASSESSMENT/PLAN:   Annual physical exam - Plan: POCT Urinalysis DIP (Proadvantage Device), CBC with Differential/Platelet, Comprehensive metabolic panel, Lipid panel, ANA, Rheumatoid factor, C-reactive protein  Raynaud's phenomenon without gangrene - infrequent, not in need of treatment. Will check ANA to eval for any auto-immune issues - Plan: ANA  Arthralgia, unspecified joint - frequent joint pains.  Currently doing well (just some L knee and occ LB) - Plan: ANA, Rheumatoid factor, C-reactive protein  Family history of rheumatoid arthritis - Plan: Rheumatoid factor  Medication monitoring encounter - Plan: CBC with Differential/Platelet, Comprehensive metabolic panel  Eczema, unspecified type - at wrist, improving.   Excessive drinking of alcohol - counseled re: risks and of current recommendations.  Discussed Ddx of L knee pain (above knee), likely tendonitis, not tender today. Can try knee sleeve/compression, and NSAID/topicals prn.  Not likely related to  knee joint at all, reassured.  May have some component of IT band as well (tender today), stretches rec.   Discussed monthly self breast exams and yearly mammograms; at least 30 minutes of aerobic activity at least 5 days/week, weight bearing exercise 2xwk; proper sunscreen use reviewed; healthy diet, including goals of calcium and vitamin D intake and alcohol recommendations (less than or equal to 1 drink/day) reviewed; regular seatbelt use; changing batteries in smoke detectors. Immunization recommendations discussed-- continue flu shots annually. Colonoscopy recommendations reviewed,UTD  Decrease alcohol to 1/day, risks reviewed in detail  F/u 1 year, sooner prn

## 2020-10-18 NOTE — Patient Instructions (Addendum)
HEALTH MAINTENANCE RECOMMENDATIONS:  It is recommended that you get at least 30 minutes of aerobic exercise at least 5 days/week (for weight loss, you may need as much as 60-90 minutes). This can be any activity that gets your heart rate up. This can be divided in 10-15 minute intervals if needed, but try and build up your endurance at least once a week.  Weight bearing exercise is also recommended twice weekly.  Eat a healthy diet with lots of vegetables, fruits and fiber.  "Colorful" foods have a lot of vitamins (ie green vegetables, tomatoes, red peppers, etc).  Limit sweet tea, regular sodas and alcoholic beverages, all of which has a lot of calories and sugar.  Up to 1 alcoholic drink daily may be beneficial for women (unless trying to lose weight, watch sugars).  Drink a lot of water.  Calcium recommendations are 1200-1500 mg daily (1500 mg for postmenopausal women or women without ovaries), and vitamin D 1000 IU daily.  This should be obtained from diet and/or supplements (vitamins), and calcium should not be taken all at once, but in divided doses.  Monthly self breast exams and yearly mammograms for women over the age of 73 is recommended.  Sunscreen of at least SPF 30 should be used on all sun-exposed parts of the skin when outside between the hours of 10 am and 4 pm (not just when at beach or pool, but even with exercise, golf, tennis, and yard work!)  Use a sunscreen that says "broad spectrum" so it covers both UVA and UVB rays, and make sure to reapply every 1-2 hours.  Remember to change the batteries in your smoke detectors when changing your clock times in the spring and fall. Carbon monoxide detectors are recommended for your home.  Use your seat belt every time you are in a car, and please drive safely and not be distracted with cell phones and texting while driving.  We discussed the risks related to alcohol intake, and the recommendation to cut back to no more than 1 drink  daily.  You can try using topical Voltaren Gel to your left knee when painful.   Calcium Content in Foods Calcium is the most abundant mineral in the body. Most of the body's calcium supply is stored in bones and teeth. Calcium helps many parts of the body function normally, including:  Blood and blood vessels.  Nerves.  Hormones.  Muscles.  Bones and teeth. When your calcium stores are low, you may be at risk for low bone mass, bone loss, and broken bones (fractures). When you get enough calcium, it helps to support strong bones and teeth throughout your life. Calcium is especially important for:  Children during growth spurts.  Girls during adolescence.  Women who are pregnant or breastfeeding.  Women after their menstrual cycle stops (postmenopause).  Women whose menstrual cycle has stopped due to anorexia nervosa or regular intense exercise.  People who cannot eat or digest dairy products.  Vegans. Recommended daily amounts of calcium:  Women (ages 54 to 74): 1,000 mg per day.  Women (ages 34 and older): 1,200 mg per day.  Men (ages 28 to 28): 1,000 mg per day.  Men (ages 66 and older): 1,200 mg per day.  Women (ages 8 to 17): 1,300 mg per day.  Men (ages 65 to 27): 1,300 mg per day. General information  Eat foods that are high in calcium. Try to get most of your calcium from food.  Some people may benefit  from taking calcium supplements. Check with your health care provider or diet and nutrition specialist (dietitian) before starting any calcium supplements. Calcium supplements may interact with certain medicines. Too much calcium may cause other health problems, such as constipation and kidney stones.  For the body to absorb calcium, it needs vitamin D. Sources of vitamin D include: ? Skin exposure to direct sunlight. ? Foods, such as egg yolks, liver, mushrooms, saltwater fish, and fortified milk. ? Vitamin D supplements. Check with your health care  provider or dietitian before starting any vitamin D supplements. What foods are high in calcium? Foods that are high in calcium contain more than 100 milligrams per serving. Fruits  Fortified orange juice or other fruit juice, 300 mg per 8 oz serving. Vegetables  Collard greens, 360 mg per 8 oz serving.  Kale, 100 mg per 8 oz serving.  Bok choy, 160 mg per 8 oz serving. Grains  Fortified ready-to-eat cereals, 100 to 1,000 mg per 8 oz serving.  Fortified frozen waffles, 200 mg in 2 waffles.  Oatmeal, 140 mg in 1 cup. Meats and other proteins  Sardines, canned with bones, 325 mg per 3 oz serving.  Salmon, canned with bones, 180 mg per 3 oz serving.  Canned shrimp, 125 mg per 3 oz serving.  Baked beans, 160 mg per 4 oz serving.  Tofu, firm, made with calcium sulfate, 253 mg per 4 oz serving. Dairy  Yogurt, plain, low-fat, 310 mg per 6 oz serving.  Nonfat milk, 300 mg per 8 oz serving.  American cheese, 195 mg per 1 oz serving.  Cheddar cheese, 205 mg per 1 oz serving.  Cottage cheese 2%, 105 mg per 4 oz serving.  Fortified soy, rice, or almond milk, 300 mg per 8 oz serving.  Mozzarella, part skim, 210 mg per 1 oz serving. The items listed above may not be a complete list of foods high in calcium. Actual amounts of calcium may be different depending on processing. Contact a dietitian for more information.   What foods are lower in calcium? Foods that are lower in calcium contain 50 mg or less per serving. Fruits  Apple, about 6 mg.  Banana, about 12 mg. Vegetables  Lettuce, 19 mg per 2 oz serving.  Tomato, about 11 mg. Grains  Rice, 4 mg per 6 oz serving.  Boiled potatoes, 14 mg per 8 oz serving.  White bread, 6 mg per slice. Meats and other proteins  Egg, 27 mg per 2 oz serving.  Red meat, 7 mg per 4 oz serving.  Chicken, 17 mg per 4 oz serving.  Fish, cod, or trout, 20 mg per 4 oz serving. Dairy  Cream cheese, regular, 14 mg per 1 Tbsp  serving.  Brie cheese, 50 mg per 1 oz serving.  Parmesan cheese, 70 mg per 1 Tbsp serving. The items listed above may not be a complete list of foods lower in calcium. Actual amounts of calcium may be different depending on processing. Contact a dietitian for more information. Summary  Calcium is an important mineral in the body because it affects many functions. Getting enough calcium helps support strong bones and teeth throughout your life.  Try to get most of your calcium from food.  Calcium supplements may interact with certain medicines. Check with your health care provider or dietitian before starting any calcium supplements. This information is not intended to replace advice given to you by your health care provider. Make sure you discuss any questions you have with  your health care provider. Document Revised: 09/22/2019 Document Reviewed: 09/22/2019 Elsevier Patient Education  [place].

## 2020-10-19 ENCOUNTER — Ambulatory Visit (INDEPENDENT_AMBULATORY_CARE_PROVIDER_SITE_OTHER): Payer: 59 | Admitting: Family Medicine

## 2020-10-19 ENCOUNTER — Encounter: Payer: Self-pay | Admitting: Family Medicine

## 2020-10-19 ENCOUNTER — Other Ambulatory Visit: Payer: Self-pay

## 2020-10-19 VITALS — BP 126/80 | HR 72 | Ht 67.0 in | Wt 156.8 lb

## 2020-10-19 DIAGNOSIS — Z0000 Encounter for general adult medical examination without abnormal findings: Secondary | ICD-10-CM | POA: Diagnosis not present

## 2020-10-19 DIAGNOSIS — Z8261 Family history of arthritis: Secondary | ICD-10-CM | POA: Diagnosis not present

## 2020-10-19 DIAGNOSIS — I7300 Raynaud's syndrome without gangrene: Secondary | ICD-10-CM | POA: Diagnosis not present

## 2020-10-19 DIAGNOSIS — M2550 Pain in unspecified joint: Secondary | ICD-10-CM

## 2020-10-19 DIAGNOSIS — L309 Dermatitis, unspecified: Secondary | ICD-10-CM

## 2020-10-19 DIAGNOSIS — Z5181 Encounter for therapeutic drug level monitoring: Secondary | ICD-10-CM

## 2020-10-19 DIAGNOSIS — F1010 Alcohol abuse, uncomplicated: Secondary | ICD-10-CM

## 2020-10-19 LAB — POCT URINALYSIS DIP (PROADVANTAGE DEVICE)
Bilirubin, UA: NEGATIVE
Glucose, UA: NEGATIVE mg/dL
Ketones, POC UA: NEGATIVE mg/dL
Leukocytes, UA: NEGATIVE
Nitrite, UA: NEGATIVE
Protein Ur, POC: NEGATIVE mg/dL
Specific Gravity, Urine: 1.01
Urobilinogen, Ur: NEGATIVE
pH, UA: 8 (ref 5.0–8.0)

## 2020-10-20 LAB — CBC WITH DIFFERENTIAL/PLATELET
Basophils Absolute: 0.1 10*3/uL (ref 0.0–0.2)
Basos: 1 %
EOS (ABSOLUTE): 0.1 10*3/uL (ref 0.0–0.4)
Eos: 1 %
Hematocrit: 39.6 % (ref 34.0–46.6)
Hemoglobin: 13.5 g/dL (ref 11.1–15.9)
Immature Grans (Abs): 0 10*3/uL (ref 0.0–0.1)
Immature Granulocytes: 0 %
Lymphocytes Absolute: 1.9 10*3/uL (ref 0.7–3.1)
Lymphs: 45 %
MCH: 33 pg (ref 26.6–33.0)
MCHC: 34.1 g/dL (ref 31.5–35.7)
MCV: 97 fL (ref 79–97)
Monocytes Absolute: 0.5 10*3/uL (ref 0.1–0.9)
Monocytes: 11 %
Neutrophils Absolute: 1.9 10*3/uL (ref 1.4–7.0)
Neutrophils: 42 %
Platelets: 242 10*3/uL (ref 150–450)
RBC: 4.09 x10E6/uL (ref 3.77–5.28)
RDW: 12.4 % (ref 11.7–15.4)
WBC: 4.4 10*3/uL (ref 3.4–10.8)

## 2020-10-20 LAB — COMPREHENSIVE METABOLIC PANEL
ALT: 25 IU/L (ref 0–32)
AST: 21 IU/L (ref 0–40)
Albumin/Globulin Ratio: 1.7 (ref 1.2–2.2)
Albumin: 4.7 g/dL (ref 3.8–4.9)
Alkaline Phosphatase: 63 IU/L (ref 44–121)
BUN/Creatinine Ratio: 10 — ABNORMAL LOW (ref 12–28)
BUN: 6 mg/dL — ABNORMAL LOW (ref 8–27)
Bilirubin Total: 0.7 mg/dL (ref 0.0–1.2)
CO2: 23 mmol/L (ref 20–29)
Calcium: 9.3 mg/dL (ref 8.7–10.3)
Chloride: 94 mmol/L — ABNORMAL LOW (ref 96–106)
Creatinine, Ser: 0.62 mg/dL (ref 0.57–1.00)
Globulin, Total: 2.7 g/dL (ref 1.5–4.5)
Glucose: 90 mg/dL (ref 65–99)
Potassium: 4.7 mmol/L (ref 3.5–5.2)
Sodium: 132 mmol/L — ABNORMAL LOW (ref 134–144)
Total Protein: 7.4 g/dL (ref 6.0–8.5)
eGFR: 102 mL/min/{1.73_m2} (ref >59)

## 2020-10-20 LAB — LIPID PANEL
Chol/HDL Ratio: 2.3 ratio (ref 0.0–4.4)
Cholesterol, Total: 268 mg/dL — ABNORMAL HIGH (ref 100–199)
HDL: 119 mg/dL (ref >39)
LDL Chol Calc (NIH): 143 mg/dL — ABNORMAL HIGH (ref 0–99)
Triglycerides: 40 mg/dL (ref 0–149)
VLDL Cholesterol Cal: 6 mg/dL (ref 5–40)

## 2020-10-20 LAB — RHEUMATOID FACTOR: Rheumatoid fact SerPl-aCnc: 10.9 IU/mL (ref <14.0)

## 2020-10-20 LAB — C-REACTIVE PROTEIN: CRP: 2 mg/L (ref 0–10)

## 2020-10-20 LAB — ANA: Anti Nuclear Antibody (ANA): NEGATIVE

## 2021-09-11 LAB — HM MAMMOGRAPHY

## 2021-09-19 ENCOUNTER — Encounter: Payer: Self-pay | Admitting: Family Medicine

## 2021-10-24 ENCOUNTER — Encounter: Payer: 59 | Admitting: Family Medicine

## 2021-12-09 NOTE — Patient Instructions (Signed)
  HEALTH MAINTENANCE RECOMMENDATIONS:  It is recommended that you get at least 30 minutes of aerobic exercise at least 5 days/week (for weight loss, you may need as much as 60-90 minutes). This can be any activity that gets your heart rate up. This can be divided in 10-15 minute intervals if needed, but try and build up your endurance at least once a week.  Weight bearing exercise is also recommended twice weekly.  Eat a healthy diet with lots of vegetables, fruits and fiber.  "Colorful" foods have a lot of vitamins (ie green vegetables, tomatoes, red peppers, etc).  Limit sweet tea, regular sodas and alcoholic beverages, all of which has a lot of calories and sugar.  Up to 1 alcoholic drink daily may be beneficial for women (unless trying to lose weight, watch sugars).  Drink a lot of water.  Calcium recommendations are 1200-1500 mg daily (1500 mg for postmenopausal women or women without ovaries), and vitamin D 1000 IU daily.  This should be obtained from diet and/or supplements (vitamins), and calcium should not be taken all at once, but in divided doses.  Monthly self breast exams and yearly mammograms for women over the age of 40 is recommended.  Sunscreen of at least SPF 30 should be used on all sun-exposed parts of the skin when outside between the hours of 10 am and 4 pm (not just when at beach or pool, but even with exercise, golf, tennis, and yard work!)  Use a sunscreen that says "broad spectrum" so it covers both UVA and UVB rays, and make sure to reapply every 1-2 hours.  Remember to change the batteries in your smoke detectors when changing your clock times in the spring and fall. Carbon monoxide detectors are recommended for your home.  Use your seat belt every time you are in a car, and please drive safely and not be distracted with cell phones and texting while driving.  Continue to get yearly flu shots. I also recommend getting the updated bivalent COVID booster when available in  the Fall. (You can get these on the same day).

## 2021-12-09 NOTE — Progress Notes (Signed)
Chief Complaint  Patient presents with   Annual Exam    Fasting annual exam with pelvic. No new concerns.    Shannon [name] is a 61 y.o. female who presents for a complete physical.  She has the following concerns:  Stabbing, "ice pick" headaches in the area above the right ear, intermittently for many years.  Much less often now than in previous years, only gets headache about 2-3x/year, relieved by ibuprofen. She has known bruxism, wears a night guard.    She has some chronic back pain.  Last year she reported doing well using ibuprofen prn, along with Turmeric and topical CBD oil to manage pain.  Today she reports that she no longer needs Tumeric or CBD, and doing well with regard to any pain.  Only occasionally needs ibuprofen. She periodically has SI issues, but exercises/stretching helps. A percussion massager and TENS unit are also helpful--only needed once or twice in the last 6 months. She uses robaxin prn (last filled in 01/2018), only needed once or twice.  Alcohol--still 3 drinks daily. Not interested at all in cutting down.  GERD:  she takes pepcid once daily with breakfast, and this controls her heartburn.  She drinks strong coffee and eats spicy foods (and has 3 drinks/day).  Immunization History  Administered Date(s) Administered   Influenza Split 03/08/2014, 03/11/2019   Influenza, High Dose Seasonal PF 09/07/2019   Influenza,inj,Quad PF,6+ Mos 03/15/2013, 03/29/2016, 03/28/2020   Influenza-Unspecified 04/04/2015, 03/24/2017, 03/10/2021   PFIZER(Purple Top)SARS-COV-2 Vaccination 09/04/2019, 09/25/2019, 03/28/2020, 09/25/2020   Pfizer Covid-19 Vaccine Bivalent Booster 96yr & up 03/08/2021   Td 02/21/2005   Tdap 03/31/2014   Zoster Recombinat (Shingrix) 06/17/2019, 08/19/2019   Last Pap smear: 06/2018, normal, with no high risk HPV   Last mammogram: 09/2021 Last colonoscopy: 05/2020, internal hemorrhoids.  5 yr f/u recommended (Dr. MCollene Mares.  Prior was 05/09/17, +polyps  (2 adenomatous, 1 hyperplastic). Last DEXA: 03/19/07, normal   Dentist: every 6 months   Ophtho: goes yearly Exercise: Gets >150 minutes/week, walking on treadmill for 10-20 minute intervals.  No current weight-bearing exercise. Does some squats and standing push-ups against the door.  Lipids screen: Lab Results  Component Value Date   CHOL 268 (H) 10/19/2020   HDL 119 10/19/2020   LDLCALC 143 (H) 10/19/2020   TRIG 40 10/19/2020   CHOLHDL 2.3 10/19/2020  She would like lipids checked again today. Current diet includes 6-7 egg yolks/week, cheese daily, ice cream daily (2 scoops), some ground beef 2x/week (small portions).  Vitamin D-OH 57 in 05/2016   PMH, PSH, SH and FH reviewed and updated  Outpatient Encounter Medications as of 12/10/2021  Medication Sig Note   azelastine (ASTELIN) 0.1 % nasal spray Place 2 sprays into both nostrils 2 (two) times daily.    azelastine (OPTIVAR) 0.05 % ophthalmic solution Place 1 drop into both eyes daily.     b complex vitamins tablet Take 1 tablet by mouth daily.    Black Elderberry (SAMBUCUS ELDERBERRY PO) Take 1 capsule by mouth daily.    CALCIUM CITRATE PO Take 1 tablet by mouth 2 (two) times daily.    cetirizine (ZYRTEC) 10 MG tablet Take 10 mg by mouth daily.    Cholecalciferol (VITAMIN D) 1000 UNITS capsule Take 1,000 Units by mouth daily.    famotidine (PEPCID) 20 MG tablet Take 20 mg by mouth 2 (two) times daily. 09/13/2020: Once daily   Multiple Vitamins-Minerals (MULTIVITAMIN WITH MINERALS) tablet Take 1 tablet by mouth daily.  Omega-3 Fatty Acids (FISH OIL) 1000 MG CAPS Take 1 capsule by mouth 2 (two) times daily.    vitamin C (ASCORBIC ACID) 500 MG tablet Take 500 mg by mouth 2 (two) times daily.    methocarbamol (ROBAXIN) 500 MG tablet Take 1-2 tablets (500-1,000 mg total) by mouth every 6 (six) hours as needed for muscle spasms. (Patient not taking: Reported on 09/13/2020) 10/19/2020: Uses prn for back pain, not needed in a long time    triamcinolone (KENALOG) 0.1 % Apply 1 application topically 2 (two) times daily as needed (eczema/rash). (Patient not taking: Reported on 12/10/2021) 12/10/2021: prn   [DISCONTINUED] ibuprofen (ADVIL) 600 MG tablet Take 1 tablet (600 mg total) by mouth every 8 (eight) hours as needed. (Patient not taking: No sig reported) 09/13/2020: As needed    [DISCONTINUED] TURMERIC PO Take 2 tablets by mouth daily.    No facility-administered encounter medications on file as of 12/10/2021.   Allergies  Allergen Reactions   Lanolin Rash   Latex Rash    ROS: The patient denies anorexia, fever, weight changes, vision changes, decreased hearing, ear pain, sore throat, breast concerns, chest pain, palpitations, dizziness, syncope, dyspnea on exertion, cough, swelling, nausea, vomiting, diarrhea, constipation, abdominal pain, melena, hematochezia, incontinence, dysuria, postmenopausal bleeding, vaginal discharge, odor or itch, genital lesions, weakness, tremor, suspicious skin lesions, depression, anxiety, abnormal bleeding/bruising, or enlarged lymph nodes.   Reflux is controlled with Pepcid. Denies any abdominal pain or dysphagia.  H/o microscopic hematuria--no visible hematuria or urinary complaints. Denies hot flashes, night sweats. Stools are unchanged--loose in the mornings (after coffee). No hemorrhoidal bleeding Back pain flares infrequently, mild. 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  Improved on the R since using voltaren gel.No longer getting numb with driving, not needing a wrist brace. +seasonal allergies, fairly well controlled. Occ R knee pain (both knees hurt if kneeling a lot). Some leg cramps near her ankles at night, worse when not well hydrated, but is present even when she is very well-hydrated.    PHYSICAL EXAM:  BP 136/80   Pulse 72   Ht '5\' 7"'$  (1.702 m)   Wt 157 lb 9.6 oz (71.5 kg)   LMP 11/25/1997   BMI 24.68 kg/m   132/70 on  repeat by MD  Wt Readings from Last 3 Encounters:  12/10/21 157 lb 9.6 oz (71.5 kg)  10/19/20 156 lb 12.8 oz (71.1 kg)  09/13/20 157 lb 12.8 oz (71.6 kg)    General Appearance:   Alert, cooperative, no distress, appears stated age    Head:   Normocephalic, without obvious abnormality, atraumatic    Eyes:   PERRL, conjunctiva/corneas clear, EOM's intact, fundi benign    Ears:   Normal TM's and external ear canals    Nose:   Normal, no drainage or sinus tenderness  Throat:   Normal mucosa, no lesions  Neck:   Supple, normal ROM; thyroid: no enlargement/tenderness/nodules; no carotid bruit or JVD.    Back:   Spine nontender, no curvature, no CVA tenderness.  Lungs:   Clear to auscultation bilaterally without wheezes, rales or ronchi; respirations unlabored    Chest Wall:   No tenderness or deformity    Heart:   Regular rate and rhythm, S1 and S2 normal, no murmur, rub or gallop    Breast Exam:   No tenderness, masses, or nipple discharge or inversion. No axillary lymphadenopathy   Abdomen:   Soft, non-tender, nondistended, normoactive bowel sounds, no masses, no  hepatosplenomegaly    Genitalia:   Normal external genitalia without lesions. BUS and vagina normal; no cervical motion tenderness. Uterus and adnexa not enlarged, nontender, no masses. Pap not performed    Rectal:   Normal sphincter tone, no masses. Heme negative stool. External hemorrhoid mildly inflamed, nontender  Extremities:   No clubbing, cyanosis or edema.  Pulses:   2+ and symmetric all extremities    Skin:   Skin color, texture, turgor normal, no lesions. Many tattoos. +cherry angiomas.  +actinic changes and freckles to skin on upper back; no suspicious lesions.   Lymph nodes:   Cervical, supraclavicular, inguinal and axillary nodes normal.    Neurologic:   Normal strength, sensation and gait; reflexes 2+ in lower extremities, 3+ in LUE, 2+ RUE                       Psych:  Normal mood, affect, hygiene and  grooming   ASSESSMENT/PLAN:  Annual physical exam - Plan: POCT Urinalysis DIP (Proadvantage Device), Comprehensive metabolic panel, Lipid panel, CBC with Differential/Platelet  Excessive drinking of alcohol - reviewed risks and recommendations (1/d or less)  Hypercholesteremia - lowfat, low cholesterol diet reviewed. HDL has been good, normal ratio. Pt desire recheck.   Discussed monthly self breast exams and yearly mammograms; at least 30 minutes of aerobic activity at least 5 days/week, weight bearing exercise 2xwk; proper sunscreen use reviewed; healthy diet, including goals of calcium and vitamin D intake and alcohol recommendations (less than or equal to 1 drink/day) reviewed; regular seatbelt use; changing batteries in smoke detectors. Immunization recommendations discussed-- continue flu shots annually. Updated COVID booster when available in the Fall.  Colonoscopy recommendations reviewed, UTD    F/u 1 year, sooner prn

## 2021-12-10 ENCOUNTER — Encounter: Payer: Self-pay | Admitting: Family Medicine

## 2021-12-10 ENCOUNTER — Ambulatory Visit (INDEPENDENT_AMBULATORY_CARE_PROVIDER_SITE_OTHER): Payer: 59 | Admitting: Family Medicine

## 2021-12-10 VITALS — BP 132/70 | HR 72 | Ht 67.0 in | Wt 157.6 lb

## 2021-12-10 DIAGNOSIS — Z0000 Encounter for general adult medical examination without abnormal findings: Secondary | ICD-10-CM

## 2021-12-10 DIAGNOSIS — F1010 Alcohol abuse, uncomplicated: Secondary | ICD-10-CM

## 2021-12-10 DIAGNOSIS — E7800 Pure hypercholesterolemia, unspecified: Secondary | ICD-10-CM

## 2021-12-10 LAB — POCT URINALYSIS DIP (PROADVANTAGE DEVICE)
Bilirubin, UA: NEGATIVE
Glucose, UA: NEGATIVE mg/dL
Ketones, POC UA: NEGATIVE mg/dL
Leukocytes, UA: NEGATIVE
Nitrite, UA: NEGATIVE
Protein Ur, POC: NEGATIVE mg/dL
Specific Gravity, Urine: 1.01
Urobilinogen, Ur: NEGATIVE
pH, UA: 6 (ref 5.0–8.0)

## 2021-12-11 LAB — CBC WITH DIFFERENTIAL/PLATELET
Basophils Absolute: 0 10*3/uL (ref 0.0–0.2)
Basos: 1 %
EOS (ABSOLUTE): 0 10*3/uL (ref 0.0–0.4)
Eos: 1 %
Hematocrit: 39.9 % (ref 34.0–46.6)
Hemoglobin: 13.7 g/dL (ref 11.1–15.9)
Immature Grans (Abs): 0 10*3/uL (ref 0.0–0.1)
Immature Granulocytes: 0 %
Lymphocytes Absolute: 1.8 10*3/uL (ref 0.7–3.1)
Lymphs: 27 %
MCH: 33.2 pg — ABNORMAL HIGH (ref 26.6–33.0)
MCHC: 34.3 g/dL (ref 31.5–35.7)
MCV: 97 fL (ref 79–97)
Monocytes Absolute: 0.6 10*3/uL (ref 0.1–0.9)
Monocytes: 9 %
Neutrophils Absolute: 4 10*3/uL (ref 1.4–7.0)
Neutrophils: 62 %
Platelets: 249 10*3/uL (ref 150–450)
RBC: 4.13 x10E6/uL (ref 3.77–5.28)
RDW: 12.4 % (ref 11.7–15.4)
WBC: 6.4 10*3/uL (ref 3.4–10.8)

## 2021-12-11 LAB — COMPREHENSIVE METABOLIC PANEL
ALT: 26 IU/L (ref 0–32)
AST: 20 IU/L (ref 0–40)
Albumin/Globulin Ratio: 2.1 (ref 1.2–2.2)
Albumin: 4.7 g/dL (ref 3.8–4.8)
Alkaline Phosphatase: 64 IU/L (ref 44–121)
BUN/Creatinine Ratio: 10 — ABNORMAL LOW (ref 12–28)
BUN: 6 mg/dL — ABNORMAL LOW (ref 8–27)
Bilirubin Total: 0.5 mg/dL (ref 0.0–1.2)
CO2: 23 mmol/L (ref 20–29)
Calcium: 9.7 mg/dL (ref 8.7–10.3)
Chloride: 97 mmol/L (ref 96–106)
Creatinine, Ser: 0.61 mg/dL (ref 0.57–1.00)
Globulin, Total: 2.2 g/dL (ref 1.5–4.5)
Glucose: 92 mg/dL (ref 70–99)
Potassium: 4.6 mmol/L (ref 3.5–5.2)
Sodium: 134 mmol/L (ref 134–144)
Total Protein: 6.9 g/dL (ref 6.0–8.5)
eGFR: 101 mL/min/{1.73_m2} (ref >59)

## 2021-12-11 LAB — LIPID PANEL
Chol/HDL Ratio: 2.7 ratio (ref 0.0–4.4)
Cholesterol, Total: 264 mg/dL — ABNORMAL HIGH (ref 100–199)
HDL: 99 mg/dL (ref >39)
LDL Chol Calc (NIH): 156 mg/dL — ABNORMAL HIGH (ref 0–99)
Triglycerides: 57 mg/dL (ref 0–149)
VLDL Cholesterol Cal: 9 mg/dL (ref 5–40)

## 2022-02-04 ENCOUNTER — Encounter: Payer: Self-pay | Admitting: Family Medicine

## 2022-02-04 ENCOUNTER — Ambulatory Visit (INDEPENDENT_AMBULATORY_CARE_PROVIDER_SITE_OTHER): Payer: 59 | Admitting: Family Medicine

## 2022-02-04 VITALS — BP 150/80 | HR 64 | Ht 67.0 in | Wt 158.0 lb

## 2022-02-04 DIAGNOSIS — M542 Cervicalgia: Secondary | ICD-10-CM

## 2022-02-04 DIAGNOSIS — W19XXXA Unspecified fall, initial encounter: Secondary | ICD-10-CM

## 2022-02-04 MED ORDER — METHOCARBAMOL 500 MG PO TABS: 500.0000 mg | ORAL_TABLET | Freq: Four times a day (QID) | ORAL | 0 refills | Status: DC | PRN

## 2022-02-04 NOTE — Progress Notes (Signed)
Chief Complaint  Patient presents with   [place] outside yesterday while mowing, hit back of her head on ground/grass. Neck and right shoulder are very sore. Has a slight HA, comes and goes-has been taking advil. Felt sleepy yesterday and was struggling to concentrate while working on computer this morning. Would prefer to wait on flu shot.    Yesterday at 10:30am she slipped while mowing down a slope.  Her feet slipped out from under her, landed on the upper back/shoulders and the back of her head also hit the ground.  No loss of consciousness.  No bleeding/laceration or "goose egg".  She took advil yesterday, helped some with R sided neck pain, and a little discomfort down the R back.  When she woke up today, her entire neck is sore (into the front muscles as well). Had a harder time concentrating today for work.  No radiation of pain into the arms. No numbness, tingling or weakness. She has a very slight headache, unsure if sinus or not--is across the forehead and to her temples.  Denies any nausea, vertigo.    PMH, PSH, SH reviewed  Outpatient Encounter Medications as of 02/04/2022  Medication Sig Note   azelastine (ASTELIN) 0.1 % nasal spray Place 2 sprays into both nostrils 2 (two) times daily.    azelastine (OPTIVAR) 0.05 % ophthalmic solution Place 1 drop into both eyes daily.     b complex vitamins tablet Take 1 tablet by mouth daily.    CALCIUM CITRATE PO Take 1 tablet by mouth 2 (two) times daily.    cetirizine (ZYRTEC) 10 MG tablet Take 10 mg by mouth daily.    Cholecalciferol (VITAMIN D) 1000 UNITS capsule Take 1,000 Units by mouth daily.    famotidine (PEPCID) 20 MG tablet Take 20 mg by mouth 2 (two) times daily. 09/13/2020: Once daily   ibuprofen (ADVIL) 200 MG tablet Take 400 mg by mouth every 6 (six) hours as needed. 02/04/2022: Last dose 8:30am   Multiple Vitamins-Minerals (MULTIVITAMIN WITH MINERALS) tablet Take 1 tablet by mouth daily.    Omega-3 Fatty Acids (FISH  OIL) 1000 MG CAPS Take 1 capsule by mouth 2 (two) times daily.    vitamin C (ASCORBIC ACID) 500 MG tablet Take 500 mg by mouth 2 (two) times daily.    methocarbamol (ROBAXIN) 500 MG tablet Take 1-2 tablets (500-1,000 mg total) by mouth every 6 (six) hours as needed for muscle spasms. (Patient not taking: Reported on 09/13/2020) 10/19/2020: Uses prn for back pain, not needed in a long time   triamcinolone (KENALOG) 0.1 % Apply 1 application topically 2 (two) times daily as needed (eczema/rash). (Patient not taking: Reported on 12/10/2021) 12/10/2021: prn   [DISCONTINUED] Black Elderberry (SAMBUCUS ELDERBERRY PO) Take 1 capsule by mouth daily. (Patient not taking: Reported on 02/04/2022)    No facility-administered encounter medications on file as of 02/04/2022.   Allergies  Allergen Reactions   Lanolin Rash   Latex Rash   ROS:  no fever, chills, URI symptoms, numbness, tingling, weakness, nausea, vomiting. Bleeding, bruising, rash.  +neck pain per HPI.    PHYSICAL EXAM:  BP (!) 150/80   Pulse 64   Ht '5\' 7"'$  (1.702 m)   Wt 158 lb (71.7 kg)   LMP 11/25/1997   BMI 24.75 kg/m   Well-appearing, slightly anxious female in no distress HEENT: conjunctiva and sclera are clear, EOMI. Fundi benign. No soft tissue swelling or tenderness to skull/scalp.  Nontender at sinuses and temporalis  muscles TMJ's. Neck: no spinal tenderness, no paraspinous spasm. Minimal tenderness at posterior neck muscles  Pain at R SCM--on palpation, stretching and engagement of the muscle. No lymphadenopathy or masses. Back: no spinal or CVA tenderness Neuro: alert and oriented. Cranial nerves 2-12 intact. Normal strength, gait, sensation. Normal finger to nose. DTR's 2+ and symmetric, except 3+ in LUE (chronic and unchanged). Skin: normal turgor, no bruising, bleeding.   ASSESSMENT/PLAN:  Neck pain on right side - pain is at R SCM.  Shown stretches, rec heat. To take ibuprofen 600-'800mg'$  TID with food, muscle relaxant prn -  Plan: methocarbamol (ROBAXIN) 500 MG tablet  Fall, initial encounter - DOI 02/03/22, hit back of head/upper shoulders/neck. No LOC.  Some neck pain today

## 2022-02-04 NOTE — Patient Instructions (Signed)
You can take ibuprofen (Advil) 3-4 tablets (600-'800mg'$  dose) three times daily with food until your pain has resolved (up to 1 week at this dose, if needed). If it bothers your stomach, you can cut the dose back some.  Use heat to the back and sides of the neck at least 3 times daily. Do the stretches as shown. Your area of discomfort is the sternocleidomastoid muscle.  You may use the muscle relaxant as needed, if it feels tight and not improved with the advil alone.  Contact us if you have new or worsening symptoms--any severe headaches, vision changes, nausea/vomiting, numbness, tingling, weakness or any cognitive changes (confusion, memory issues, etc).

## 2022-02-13 ENCOUNTER — Encounter: Payer: Self-pay | Admitting: Internal Medicine

## 2022-06-18 ENCOUNTER — Encounter: Payer: Self-pay | Admitting: Internal Medicine

## 2022-10-24 LAB — HM MAMMOGRAPHY

## 2022-10-30 ENCOUNTER — Encounter: Payer: Self-pay | Admitting: *Deleted

## 2022-12-17 NOTE — Patient Instructions (Incomplete)
  HEALTH MAINTENANCE RECOMMENDATIONS:  It is recommended that you get at least 30 minutes of aerobic exercise at least 5 days/week (for weight loss, you may need as much as 60-90 minutes). This can be any activity that gets your heart rate up. This can be divided in 10-15 minute intervals if needed, but try and build up your endurance at least once a week.  Weight bearing exercise is also recommended twice weekly.  Eat a healthy diet with lots of vegetables, fruits and fiber.  "Colorful" foods have a lot of vitamins (ie green vegetables, tomatoes, red peppers, etc).  Limit sweet tea, regular sodas and alcoholic beverages, all of which has a lot of calories and sugar.  Up to 1 alcoholic drink daily may be beneficial for women (unless trying to lose weight, watch sugars).  Drink a lot of water.  Calcium recommendations are 1200-1500 mg daily (1500 mg for postmenopausal women or women without ovaries), and vitamin D 1000 IU daily.  This should be obtained from diet and/or supplements (vitamins), and calcium should not be taken all at once, but in divided doses.  Monthly self breast exams and yearly mammograms for women over the age of 53 is recommended.  Sunscreen of at least SPF 30 should be used on all sun-exposed parts of the skin when outside between the hours of 10 am and 4 pm (not just when at beach or pool, but even with exercise, golf, tennis, and yard work!)  Use a sunscreen that says "broad spectrum" so it covers both UVA and UVB rays, and make sure to reapply every 1-2 hours.  Remember to change the batteries in your smoke detectors when changing your clock times in the spring and fall. Carbon monoxide detectors are recommended for your home.  Use your seat belt every time you are in a car, and please drive safely and not be distracted with cell phones and texting while driving.  Continue yearly flu shots in the Fall. I recommend getting the new/updated COVID booster when it becomes  available in the Fall.  You can consider getting the RSV vaccine in the Fall (people 60 and over are eligible; we do not carry this in our office, you would need to get from the pharmacy, if desired)  I encourage you to cut back on alcohol.  Try melatonin to help with sleep.  Stop ibuprofen while taking meloxicam.  Take the meloxicam once daily with food.  If you have any additional pain, you can only take tylenol along with this medication (and your muscle relaxant).  No ibuprofen/advil/motrin/aleve/Goody or BC powder while taking this new prescription.  Take the meloxicam once daily (with food) until your pain has resolved.  You don't need to complete the bottle, you can keep any extra on hand for pain in the future (just remember not to mix with ibuprofen). If it bothers your stomach, you can increase your pepcid to twice daily, and/or cut the dose in half.  If your pain doesn't resolve, let us know and we can send you to Sports Medicine.  You can consider wearing a tennis elbow strap when doing activities using your elbow/wrist.

## 2022-12-17 NOTE — Progress Notes (Signed)
No chief complaint on file.  Shannon [name] is a 62 y.o. female who presents for a complete physical.  She has the following concerns:  Stabbing, "ice pick" headaches in the area above the right ear, intermittently for many years.  Much less often now than in previous years, only gets headache about 2-3x/year, relieved by ibuprofen. She has known bruxism, wears a night guard.    She has some chronic back pain.  Last year she reported doing well using ibuprofen prn, not often (previously used to also use turmeric and topical CBD oil to manage pain, no longer needed). She periodically has SI issues, but exercises/stretching helps. A percussion massager and TENS unit are also helpful--only needed once or twice in the last 6 months. UPDATE She uses robaxin prn (last filled in 01/2022 for neck pain), only needed once or twice. UPDATE  She had fallen in August 2023, had some pain in neck at [facility] muscle, for which she was prescribed robaxin.  Hyperlipidemia: LDL increased from 143 (in 2022) to 156 last year. At that time she reported eating  6-7 egg yolks/week, cheese daily, ice cream daily (2 scoops), some ground beef 2x/week (small portions). Currently her diet includes:  UPDATE  She is due for recheck today.  Lab Results  Component Value Date   CHOL 264 (H) 12/10/2021   HDL 99 12/10/2021   LDLCALC 156 (H) 12/10/2021   TRIG 57 12/10/2021   CHOLHDL 2.7 12/10/2021     Alcohol--still 3 drinks daily. Not interested at all in cutting down.  GERD:  she takes pepcid once daily with breakfast, and this controls her heartburn.  She drinks strong coffee and eats spicy foods (and has 3 drinks/day).  Immunization History  Administered Date(s) Administered   COVID-19, mRNA, vaccine(Comirnaty)12 years and older 03/10/2022   Influenza Split 03/08/2014, 03/11/2019, 03/10/2022   Influenza, High Dose Seasonal PF 09/07/2019   Influenza,inj,Quad PF,6+ Mos 03/15/2013, 03/29/2016, 03/28/2020    Influenza-Unspecified 04/04/2015, 03/24/2017, 03/10/2021   PFIZER(Purple Top)SARS-COV-2 Vaccination 09/04/2019, 09/25/2019, 03/28/2020, 09/25/2020   Pfizer Covid-19 Vaccine Bivalent Booster 29yrs & up 03/08/2021   Td 02/21/2005   Tdap 03/31/2014   Zoster Recombinant(Shingrix) 06/17/2019, 08/19/2019   Last Pap smear: 06/2018, normal, with no high risk HPV   Last mammogram: 10/2022 Last colonoscopy: 05/2020, internal hemorrhoids.  5 yr f/u recommended (Dr. Loreta Ave).  Prior was 05/09/17, +polyps (2 adenomatous, 1 hyperplastic). Last DEXA: 03/19/07, normal   Dentist: every 6 months   Ophtho: goes yearly Exercise:  Gets >150 minutes/week, walking on treadmill for 10-20 minute intervals.   No current weight-bearing exercise. Does some squats and standing push-ups against the door.   Vitamin D-OH 57 in 05/2016   PMH, PSH, SH and FH reviewed and updated     ROS: The patient denies anorexia, fever, weight changes, vision changes, decreased hearing, ear pain, sore throat, breast concerns, chest pain, palpitations, dizziness, syncope, dyspnea on exertion, cough, swelling, nausea, vomiting, diarrhea, constipation, abdominal pain, melena, hematochezia, incontinence, dysuria, postmenopausal bleeding, vaginal discharge, odor or itch, genital lesions, weakness, tremor, suspicious skin lesions, depression, anxiety, abnormal bleeding/bruising, or enlarged lymph nodes.   Reflux is controlled with Pepcid. Denies any abdominal pain or dysphagia.  H/o microscopic hematuria--no visible hematuria or urinary complaints. Denies hot flashes, night sweats. Stools are unchanged--loose in the mornings (after coffee). No hemorrhoidal bleeding Back pain flares infrequently, mild. 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  Improved on the R since using voltaren gel. No  longer getting numb with driving, not needing a wrist brace. +seasonal allergies, fairly well  controlled. Occ R knee pain (both knees hurt if kneeling a lot). Some leg cramps near her ankles at night, worse when not well hydrated, but is present even when she is very well-hydrated.    PHYSICAL EXAM:  LMP 11/25/1997    Wt Readings from Last 3 Encounters:  02/04/22 158 lb (71.7 kg)  12/10/21 157 lb 9.6 oz (71.5 kg)  10/19/20 156 lb 12.8 oz (71.1 kg)    General Appearance:   Alert, cooperative, no distress, appears stated age    Head:   Normocephalic, without obvious abnormality, atraumatic    Eyes:   PERRL, conjunctiva/corneas clear, EOM's intact, fundi benign    Ears:   Normal TM's and external ear canals    Nose:   Normal, no drainage or sinus tenderness  Throat:   Normal mucosa, no lesions  Neck:   Supple, normal ROM; thyroid: no enlargement/tenderness/nodules; no carotid bruit or JVD.    Back:   Spine nontender, no curvature, no CVA tenderness.  Lungs:   Clear to auscultation bilaterally without wheezes, rales or ronchi; respirations unlabored    Chest Wall:   No tenderness or deformity    Heart:   Regular rate and rhythm, S1 and S2 normal, no murmur, rub or gallop    Breast Exam:   No tenderness, masses, or nipple discharge or inversion. No axillary lymphadenopathy   Abdomen:   Soft, non-tender, nondistended, normoactive bowel sounds, no masses, no hepatosplenomegaly    Genitalia:   Normal external genitalia without lesions. BUS and vagina normal; no cervical motion tenderness. Uterus and adnexa not enlarged, nontender, no masses. Pap not performed    Rectal:   Normal sphincter tone, no masses. Heme negative stool. External hemorrhoid mildly inflamed, nontender  Extremities:   No clubbing, cyanosis or edema.  Pulses:   2+ and symmetric all extremities    Skin:   Skin color, texture, turgor normal, no lesions. Many tattoos. +cherry angiomas.  +actinic changes and freckles to skin on upper back; no suspicious lesions.   Lymph nodes:   Cervical, supraclavicular, inguinal  and axillary nodes normal.    Neurologic:   Normal strength, sensation and gait; reflexes 2+ in lower extremities, 3+ in LUE, 2+ RUE                       Psych:  Normal mood, affect, hygiene and grooming  ***UPDATE If pap done Update skin  ASSESSMENT/PLAN:  Last pap 4.5 years ago; rec is q5 years.  See if she wants it today (a little early, but covered by insurance), or if she prefers to wait until next year     Consider RSV in fall (not high risk)  Discussed monthly self breast exams and yearly mammograms; at least 30 minutes of aerobic activity at least 5 days/week, weight bearing exercise 2xwk; proper sunscreen use reviewed; healthy diet, including goals of calcium and vitamin D intake and alcohol recommendations (less than or equal to 1 drink/day) reviewed; regular seatbelt use; changing batteries in smoke detectors. Immunization recommendations discussed-- continue flu shots annually. Updated COVID booster when available in the Fall.  Tetanus booster next year. Can consider RSV in the Fall Colonoscopy recommendations reviewed, UTD    F/u 1 year, sooner prn

## 2022-12-18 ENCOUNTER — Encounter: Payer: Self-pay | Admitting: Family Medicine

## 2022-12-18 ENCOUNTER — Ambulatory Visit (INDEPENDENT_AMBULATORY_CARE_PROVIDER_SITE_OTHER): Payer: Managed Care, Other (non HMO) | Admitting: Family Medicine

## 2022-12-18 VITALS — BP 122/80 | HR 67 | Ht 67.25 in | Wt 155.4 lb

## 2022-12-18 DIAGNOSIS — M545 Low back pain, unspecified: Secondary | ICD-10-CM

## 2022-12-18 DIAGNOSIS — E7800 Pure hypercholesterolemia, unspecified: Secondary | ICD-10-CM | POA: Diagnosis not present

## 2022-12-18 DIAGNOSIS — M7711 Lateral epicondylitis, right elbow: Secondary | ICD-10-CM | POA: Diagnosis not present

## 2022-12-18 DIAGNOSIS — F1010 Alcohol abuse, uncomplicated: Secondary | ICD-10-CM

## 2022-12-18 DIAGNOSIS — Z0000 Encounter for general adult medical examination without abnormal findings: Secondary | ICD-10-CM

## 2022-12-18 LAB — POCT URINALYSIS DIP (CLINITEK)
Bilirubin, UA: NEGATIVE
Glucose, UA: NEGATIVE mg/dL
Ketones, POC UA: NEGATIVE mg/dL
Leukocytes, UA: NEGATIVE
Nitrite, UA: NEGATIVE
POC PROTEIN,UA: NEGATIVE
Spec Grav, UA: 1.01 (ref 1.010–1.025)
Urobilinogen, UA: 0.2 E.U./dL
pH, UA: 7.5 (ref 5.0–8.0)

## 2022-12-18 MED ORDER — MELOXICAM 15 MG PO TABS
15.0000 mg | ORAL_TABLET | Freq: Every day | ORAL | 0 refills | Status: DC
Start: 2022-12-18 — End: 2023-06-09

## 2022-12-19 LAB — CBC WITH DIFFERENTIAL/PLATELET
Basophils Absolute: 0.1 10*3/uL (ref 0.0–0.2)
Basos: 1 %
EOS (ABSOLUTE): 0.1 10*3/uL (ref 0.0–0.4)
Eos: 1 %
Hematocrit: 39.7 % (ref 34.0–46.6)
Hemoglobin: 13.8 g/dL (ref 11.1–15.9)
Immature Grans (Abs): 0 10*3/uL (ref 0.0–0.1)
Immature Granulocytes: 0 %
Lymphocytes Absolute: 2 10*3/uL (ref 0.7–3.1)
Lymphs: 38 %
MCH: 33.8 pg — ABNORMAL HIGH (ref 26.6–33.0)
MCHC: 34.8 g/dL (ref 31.5–35.7)
MCV: 97 fL (ref 79–97)
Monocytes Absolute: 0.6 10*3/uL (ref 0.1–0.9)
Monocytes: 11 %
Neutrophils Absolute: 2.6 10*3/uL (ref 1.4–7.0)
Neutrophils: 49 %
Platelets: 251 10*3/uL (ref 150–450)
RBC: 4.08 x10E6/uL (ref 3.77–5.28)
RDW: 12.1 % (ref 11.7–15.4)
WBC: 5.2 10*3/uL (ref 3.4–10.8)

## 2022-12-19 LAB — CMP14+EGFR
ALT: 24 IU/L (ref 0–32)
AST: 22 IU/L (ref 0–40)
Albumin: 4.7 g/dL (ref 3.9–4.9)
Alkaline Phosphatase: 59 IU/L (ref 44–121)
BUN/Creatinine Ratio: 10 — ABNORMAL LOW (ref 12–28)
BUN: 6 mg/dL — ABNORMAL LOW (ref 8–27)
Bilirubin Total: 0.7 mg/dL (ref 0.0–1.2)
CO2: 23 mmol/L (ref 20–29)
Calcium: 9.7 mg/dL (ref 8.7–10.3)
Chloride: 93 mmol/L — ABNORMAL LOW (ref 96–106)
Creatinine, Ser: 0.61 mg/dL (ref 0.57–1.00)
Globulin, Total: 2.4 g/dL (ref 1.5–4.5)
Glucose: 86 mg/dL (ref 70–99)
Potassium: 4.6 mmol/L (ref 3.5–5.2)
Sodium: 132 mmol/L — ABNORMAL LOW (ref 134–144)
Total Protein: 7.1 g/dL (ref 6.0–8.5)
eGFR: 100 mL/min/{1.73_m2} (ref >59)

## 2022-12-19 LAB — LIPID PANEL
Chol/HDL Ratio: 2.6 ratio (ref 0.0–4.4)
Cholesterol, Total: 256 mg/dL — ABNORMAL HIGH (ref 100–199)
HDL: 100 mg/dL (ref >39)
LDL Chol Calc (NIH): 146 mg/dL — ABNORMAL HIGH (ref 0–99)
Triglycerides: 60 mg/dL (ref 0–149)
VLDL Cholesterol Cal: 10 mg/dL (ref 5–40)

## 2023-03-15 ENCOUNTER — Encounter: Payer: Self-pay | Admitting: Family Medicine

## 2023-03-17 ENCOUNTER — Encounter: Payer: Self-pay | Admitting: *Deleted

## 2023-06-04 ENCOUNTER — Other Ambulatory Visit: Payer: Self-pay | Admitting: Medical Genetics

## 2023-06-06 ENCOUNTER — Other Ambulatory Visit (HOSPITAL_COMMUNITY)
Admission: RE | Admit: 2023-06-06 | Discharge: 2023-06-06 | Disposition: A | Discharge status: Home / Self Care | Payer: Self-pay | Source: Ambulatory Visit | Attending: Oncology | Admitting: Oncology

## 2023-06-09 ENCOUNTER — Ambulatory Visit (INDEPENDENT_AMBULATORY_CARE_PROVIDER_SITE_OTHER): Payer: Managed Care, Other (non HMO) | Admitting: Medical

## 2023-06-09 VITALS — BP 124/68 | HR 65 | Temp 97.0°F | Wt 158.0 lb

## 2023-06-09 DIAGNOSIS — H938X3 Other specified disorders of ear, bilateral: Secondary | ICD-10-CM | POA: Diagnosis not present

## 2023-06-09 DIAGNOSIS — T148XXA Other injury of unspecified body region, initial encounter: Secondary | ICD-10-CM | POA: Diagnosis not present

## 2023-06-09 DIAGNOSIS — H6993 Unspecified Eustachian tube disorder, bilateral: Secondary | ICD-10-CM | POA: Diagnosis not present

## 2023-06-09 MED ORDER — NEOMYCIN-POLYMYXIN-HC 3.5-10000-1 OT SOLN: 3.0000 [drp] | Freq: Three times a day (TID) | OTIC | 0 refills | Status: DC

## 2023-06-09 MED ORDER — MUPIROCIN 2 % EX OINT: 1.0000 | TOPICAL_OINTMENT | Freq: Two times a day (BID) | CUTANEOUS | 0 refills | Status: DC

## 2023-06-09 NOTE — Patient Instructions (Signed)
Eustachian tube dysfunction/ear pressure and abrasion of skin of left ear pinna  Recommendations: Be good about your water intake over the next few days at least 80 ounces of water daily Continue Sudafed decongestant mouth the next 3 to 5 days Use Afrin no more than 3 days in a row as it can be hard to come off of this due to rebound congestion You can continue your other regular allergy medicine such as Astelin nasal spray and Zyrtec by mouth You can begin Cortisporin otic eardrops in each ear 3 times a day.  This is a topical antibiotic with a steroid to reduce inflammation.  This will probably help quite a bit You have an an abrasion of the left ear pinna.  You can use the mupirocin ointment that I prescribed to that area.  You can alternate this with the hydrocortisone cream at home as well If you do not feel significantly improved by Friday call back Sometimes with use oral prednisone or oral antibiotic if things are worsening

## 2023-06-09 NOTE — Progress Notes (Signed)
Subjective:  Shannon [name] is a 62 y.o. female who presents for Chief Complaint  Patient presents with   ears clogged    Ears clogged x 2 weeks, some pain feels like she is in a tunnel     Here for ears feeling clogged up 2.5 weeks.  Try some OTC drops, improved a little.   Feels some continuous nasal drainage.   No sore throat.  No fever.  No cough.   No dizziness.   No NVD.   Using some decongestant.  Also describes left ear a little bit trying to mess with the ear.  No other aggravating or relieving factors.    No other c/o.  Past Medical History:  Diagnosis Date   Benign hematuria    negative workup in past (in [place])   Cerebral AVM    Dr. Franky Macho   GERD (gastroesophageal reflux disease)    IBS (irritable bowel syndrome)    Current Outpatient Medications on File Prior to Visit  Medication Sig Dispense Refill   azelastine (ASTELIN) 0.1 % nasal spray Place 2 sprays into both nostrils 2 (two) times daily.     azelastine (OPTIVAR) 0.05 % ophthalmic solution Place 1 drop into both eyes daily.     b complex vitamins tablet Take 1 tablet by mouth daily.     CALCIUM CITRATE PO Take 1 tablet by mouth 2 (two) times daily.     cetirizine (ZYRTEC) 10 MG tablet Take 10 mg by mouth daily.     Cholecalciferol (VITAMIN D) 1000 UNITS capsule Take 1,000 Units by mouth daily.     famotidine (PEPCID) 20 MG tablet Take 20 mg by mouth daily.     Multiple Vitamins-Minerals (MULTIVITAMIN WITH MINERALS) tablet Take 1 tablet by mouth daily.     Omega-3 Fatty Acids (FISH OIL) 1000 MG CAPS Take 1 capsule by mouth daily.     vitamin C (ASCORBIC ACID) 500 MG tablet Take 500 mg by mouth daily.     methocarbamol (ROBAXIN) 500 MG tablet Take 1-2 tablets (500-1,000 mg total) by mouth every 6 (six) hours as needed for muscle spasms. (Patient not taking: Reported on 06/09/2023) 30 tablet 0   No current facility-administered medications on file prior to visit.     The following portions of the patient's history  were reviewed and updated as appropriate: allergies, current medications, past family history, past medical history, past social history, past surgical history and problem list.  ROS Otherwise as in subjective above  Objective: BP 124/68   Pulse 65   Temp (!) 97 F (36.1 C)   Wt 158 lb (71.7 kg)   LMP 11/25/1997   BMI 24.56 kg/m   General appearance: alert, no distress, well developed, well nourished Left ear pinna from a scratch HEENT: normocephalic, sclerae anicteric, conjunctiva pink and moist, TMs somewhat bulging with air-fluid levels without erythema, nares patent, no discharge or erythema, pharynx normal Oral cavity: MMM, no lesions Neck: supple, no lymphadenopathy, no thyromegaly, no masses    Assessment: Encounter Diagnoses  Name Primary?   Dysfunction of both eustachian tubes Yes   Pressure sensation in both ears    Abrasion of skin      Plan: We discussed symptoms and concerns and exam findings.  Discussed following recommendations  Patient Instructions  Eustachian tube dysfunction/ear pressure and abrasion of skin of left ear pinna  Recommendations: Be good about your water intake over the next few days at least 80 ounces of water daily Continue Sudafed decongestant  mouth the next 3 to 5 days Use Afrin no more than 3 days in a row as it can be hard to come off of this due to rebound congestion You can continue your other regular allergy medicine such as Astelin nasal spray and Zyrtec by mouth You can begin Cortisporin otic eardrops in each ear 3 times a day.  This is a topical antibiotic with a steroid to reduce inflammation.  This will probably help quite a bit You have an an abrasion of the left ear pinna.  You can use the mupirocin ointment that I prescribed to that area.  You can alternate this with the hydrocortisone cream at home as well If you do not feel significantly improved by Friday call back Sometimes with use oral prednisone or oral antibiotic  if things are worsening   Felcia was seen today for ears clogged.  Diagnoses and all orders for this visit:  Dysfunction of both eustachian tubes  Pressure sensation in both ears  Abrasion of skin  Other orders -     neomycin-polymyxin-hydrocortisone (CORTISPORIN) OTIC solution; Place 3 drops into both ears 3 (three) times daily. -     mupirocin ointment (BACTROBAN) 2 %; Apply 1 Application topically 2 (two) times daily.   Follow up: prn

## 2023-06-21 LAB — GENECONNECT MOLECULAR SCREEN: Genetic Analysis Overall Interpretation: NEGATIVE

## 2023-11-12 LAB — HM MAMMOGRAPHY

## 2023-11-13 ENCOUNTER — Encounter: Payer: Self-pay | Admitting: Family Medicine

## 2023-12-22 NOTE — Progress Notes (Unsigned)
 No chief complaint on file.  Shannon [name] is a 63 y.o. female who presents for a complete physical.  She has the following concerns:  R wrist and elbow pain?   Uses voltaren  gel prn.  Previously used TENS unit for the elbow pain. ***UPDATE   Stabbing, ice pick headaches in the area above the right ear remains much less often than in previous years, only gets headache about 2-3x/year, relieved by ibuprofen . She has known bruxism, wears a night guard.    She has some chronic back pain.  Overall doing well.  It occasionally flares related to yardwork. She uses ibuprofen , robaxin , TENS unit and percussion massager as needed for flares (usually flares about 2x/year). She periodically has SI issues, but exercises/stretching helps. She uses robaxin  prn (last filled  #30 in 01/2022 originally for neck pain)   Hyperlipidemia: LDL went up to 156 in 12/2021, when she reported eating  6-7 egg yolks/week, cheese daily, ice cream daily (2 scoops), some ground beef 2x/week (small portions). Last year at her physical she reported following more of a plant-based diet (for about 2.5 months at that time), only having meat 5x/month in small portions.  She had cut back on ice cream, cheese and eggs. LDL was 146 on this diet last year. She has excellent HDL. She is due for recheck of lipids today.  Current diet includes: ***  meat 5x/month, small portions. Ice cream Eggs Proteins include beans, nutritional yeast, lentils, nuts, seeds. Adds some Orgain protein powder to some foods. She switched to almond milk.  Has yogurt and some cheese as only dairy.   Component Ref Range & Units (hover) 1 yr ago 2 yr ago 3 yr ago 6 yr ago 8 yr ago 10 yr ago 13 yr ago  Cholesterol, Total 256 High  264 High  268 High       Triglycerides 60 57 40 65 R 31 R 48 R 47 R  HDL 100 99 119 78 Abnormal  R 138 R, CM 91 79  VLDL Cholesterol Cal 10 9 6       LDL Chol Calc (NIH) 146 High  156 High  143 High       Chol/HDL Ratio  2.6 2.7 CM 2.3 CM  1.6 R 2.4 R 2.6 R    Alcohol--still 3 drinks daily. Not interested at all in cutting down. She enjoys it (switched to vodka for the summer), and reports it helps her sleep. She struggles getting to sleep otherwise, tried things like meditation, but not helpful.    GERD:  she takes pepcid once daily with breakfast, and this controls her heartburn.  She drinks strong coffee (2 cups in the morning) and eats spicy foods (and has 3 drinks/day).  Immunization History  Administered Date(s) Administered   Influenza Split 03/08/2014, 03/11/2019, 03/10/2022   Influenza, High Dose Seasonal PF 09/07/2019   Influenza,inj,Quad PF,6+ Mos 03/15/2013, 03/29/2016, 03/28/2020   Influenza-Unspecified 04/04/2015, 03/24/2017, 03/10/2021, 03/09/2023   PFIZER(Purple Top)SARS-COV-2 Vaccination 09/04/2019, 09/25/2019, 03/28/2020, 09/25/2020   Pfizer Covid-19 Vaccine Bivalent Booster 22yrs & up 03/08/2021   Pfizer(Comirnaty)Fall Seasonal Vaccine 12 years and older 03/10/2022, 03/09/2023   Td 02/21/2005   Tdap 03/31/2014   Zoster Recombinant(Shingrix ) 06/17/2019, 08/19/2019   Last Pap smear: 06/2018, normal, with no high risk HPV   Last mammogram: 11/2023 Last colonoscopy: 05/2020, internal hemorrhoids.  5 yr f/u recommended (Dr. Kristie).  Prior was 05/09/17, +polyps (2 adenomatous, 1 hyperplastic). Last DEXA: 03/19/07, normal   Dentist: every 6 months  Ophtho: goes yearly Exercise:   Walks at least 30 minutes 5 days/week (10-20 mins at a time on a workday, or will make up missed days by walking 45 mins on the weekend). Neck/shoulder exercises. Uses resistance bands for upper body only. Does some squats and standing push-ups against the door.  Vitamin D -OH 57 in 05/2016   PMH, PSH, SH and FH reviewed and updated     ROS: The patient denies anorexia, fever, weight changes, vision changes, decreased hearing, ear pain, sore throat, breast concerns, chest pain, palpitations, dizziness,  syncope, dyspnea on exertion, cough, swelling, nausea, vomiting, diarrhea, constipation, abdominal pain, melena, hematochezia, incontinence, dysuria, postmenopausal bleeding, vaginal discharge, odor or itch, genital lesions, weakness, tremor, suspicious skin lesions, depression, anxiety, abnormal bleeding/bruising, or enlarged lymph nodes.   Reflux is controlled with Pepcid. Denies any abdominal pain or dysphagia.  H/o microscopic hematuria--no visible hematuria or urinary complaints. Denies hot flashes, night sweats. Stools are unchanged--loose in the mornings (after coffee). No hemorrhoidal bleeding Back pain flares infrequently, mild. 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  Wrist and elbow pain per HPI. +seasonal allergies, fairly well controlled. Occ L>R knee pain (both knees hurt if kneeling a lot). Some leg cramps near her ankles at night, worse when not well hydrated, but is present even when she is very well-hydrated. (Feet, arches, ankles).     PHYSICAL EXAM:  LMP 11/25/1997    Wt Readings from Last 3 Encounters:  06/09/23 158 lb (71.7 kg)  12/18/22 155 lb 6.4 oz (70.5 kg)  02/04/22 158 lb (71.7 kg)    General Appearance:   Alert, cooperative, no distress, appears stated age    Head:   Normocephalic, without obvious abnormality, atraumatic    Eyes:   PERRL, conjunctiva/corneas clear, EOM's intact, fundi benign    Ears:   Normal TM's and external ear canals    Nose:   Normal, no drainage or sinus tenderness  Throat:   Normal mucosa, no lesions  Neck:   Supple, normal ROM; thyroid: no enlargement/tenderness/nodules; no carotid bruit or JVD.    Back:   Spine nontender, no curvature, no CVA tenderness.  Lungs:   Clear to auscultation bilaterally without wheezes, rales or ronchi; respirations unlabored    Chest Wall:   No tenderness or deformity    Heart:   Regular rate and rhythm, S1 and S2 normal, no murmur, rub or gallop     Breast Exam:   No tenderness, masses, or nipple discharge or inversion. No axillary lymphadenopathy   Abdomen:   Soft, non-tender, nondistended, normoactive bowel sounds, no masses, no hepatosplenomegaly    Genitalia:   Normal external genitalia without lesions. BUS and vagina normal; no cervical motion tenderness. Uterus and adnexa not enlarged, nontender, no masses. No cervical lesions or discharge. Pap performed    Rectal:   Normal sphincter tone, no masses. Heme negative stool. External hemorrhoid moderately inflamed, nontender ***  Extremities:   No clubbing, cyanosis or edema. Tender at R lateral epicondyle.  No pain with supination/pronation or wrist flexion/extension, normal strength. ***  Pulses:   2+ and symmetric all extremities    Skin:   Skin color, texture, turgor normal, no lesions. Many tattoos. +cherry angiomas.  +actinic changes and freckles to skin on upper back; no suspicious lesions.   Lymph nodes:   Cervical, supraclavicular, inguinal and axillary nodes normal.    Neurologic:   Normal strength, sensation and gait; reflexes 2+ in lower extremities,  Psych:  Normal mood, affect, hygiene and grooming  ***UPDATE if hemorrhoids inflamed, tender R lateral epicondyle/extremity exam  ASSESSMENT/PLAN:  Cbc, c-met, lipids.   D only if change in supplements TSH if any symptoms.  pap TdaP, Prevnar-20. If doesn't want both the same day, do the TdaP Robaxin  RF?  Discussed monthly self breast exams and yearly mammograms; at least 30 minutes of aerobic activity at least 5 days/week, weight bearing exercise 2xwk; proper sunscreen use reviewed; healthy diet, including goals of calcium and vitamin D  intake and alcohol recommendations (less than or equal to 1 drink/day) reviewed; regular seatbelt use; changing batteries in smoke detectors. Immunization recommendations discussed-- continue flu shots annually. Updated COVID booster when available in the Fall.   TdaP  *** Prevnar-20*** Colonoscopy recommendations reviewed, UTD, due 05/2025 DEXA next year    F/u 1 year, sooner prn

## 2023-12-23 NOTE — Patient Instructions (Incomplete)
  HEALTH MAINTENANCE RECOMMENDATIONS:  It is recommended that you get at least 30 minutes of aerobic exercise at least 5 days/week (for weight loss, you may need as much as 60-90 minutes). This can be any activity that gets your heart rate up. This can be divided in 10-15 minute intervals if needed, but try and build up your endurance at least once a week.  Weight bearing exercise is also recommended twice weekly.  Eat a healthy diet with lots of vegetables, fruits and fiber.  Colorful foods have a lot of vitamins (ie green vegetables, tomatoes, red peppers, etc).  Limit sweet tea, regular sodas and alcoholic beverages, all of which has a lot of calories and sugar.  Up to 1 alcoholic drink daily may be beneficial for women (unless trying to lose weight, watch sugars).  Drink a lot of water.  Calcium recommendations are 1200-1500 mg daily (1500 mg for postmenopausal women or women without ovaries), and vitamin D  1000 IU daily.  This should be obtained from diet and/or supplements (vitamins), and calcium should not be taken all at once, but in divided doses.  Monthly self breast exams and yearly mammograms for women over the age of 71 is recommended.  Sunscreen of at least SPF 30 should be used on all sun-exposed parts of the skin when outside between the hours of 10 am and 4 pm (not just when at beach or pool, but even with exercise, golf, tennis, and yard work!)  Use a sunscreen that says broad spectrum so it covers both UVA and UVB rays, and make sure to reapply every 1-2 hours.  Remember to change the batteries in your smoke detectors when changing your clock times in the spring and fall. Carbon monoxide detectors are recommended for your home.  Use your seat belt every time you are in a car, and please drive safely and not be distracted with cell phones and texting while driving.  Please try and cut back on the alcohol intake (to 1/day). You may try benadryl at bedtime to see if this offsets  the insomnia issue that occurs when you drink less alcohol. You can alternatively re-try Melatonin--low dose 2 hours before bedtime, with another dose at bedtime if needed.  We discussed the recommendation for pneumonia vaccine--you can get either Prevnar-20, or Capvaxive-21.  They are both available through the pharmacy, if that works better for you (wait 2 weeks from today's tetanus shot). We currently carry only the Prevnar--if you'd like that one, you can schedule a nurse visit with our office if you prefer that over getting it from the pharmacy. Alternatively, we can do this at your physical next year. You only need one or the other (not both!), and shouldn't need any boosters of this.

## 2023-12-24 ENCOUNTER — Encounter: Payer: Self-pay | Admitting: Family Medicine

## 2023-12-24 ENCOUNTER — Ambulatory Visit (INDEPENDENT_AMBULATORY_CARE_PROVIDER_SITE_OTHER): Payer: Managed Care, Other (non HMO) | Admitting: Family Medicine

## 2023-12-24 ENCOUNTER — Other Ambulatory Visit (HOSPITAL_COMMUNITY)
Admission: RE | Admit: 2023-12-24 | Discharge: 2023-12-24 | Disposition: A | Discharge status: Home / Self Care | Source: Ambulatory Visit | Attending: Family Medicine | Admitting: Family Medicine

## 2023-12-24 VITALS — BP 126/76 | HR 80 | Ht 68.0 in | Wt 159.0 lb

## 2023-12-24 DIAGNOSIS — Z0000 Encounter for general adult medical examination without abnormal findings: Secondary | ICD-10-CM | POA: Diagnosis present

## 2023-12-24 DIAGNOSIS — L299 Pruritus, unspecified: Secondary | ICD-10-CM | POA: Diagnosis not present

## 2023-12-24 DIAGNOSIS — K219 Gastro-esophageal reflux disease without esophagitis: Secondary | ICD-10-CM

## 2023-12-24 DIAGNOSIS — Z23 Encounter for immunization: Secondary | ICD-10-CM

## 2023-12-24 DIAGNOSIS — Z5181 Encounter for therapeutic drug level monitoring: Secondary | ICD-10-CM

## 2023-12-24 DIAGNOSIS — F1010 Alcohol abuse, uncomplicated: Secondary | ICD-10-CM | POA: Diagnosis not present

## 2023-12-24 DIAGNOSIS — E7800 Pure hypercholesterolemia, unspecified: Secondary | ICD-10-CM

## 2023-12-24 LAB — LIPID PANEL

## 2023-12-24 MED ORDER — NEOMYCIN-POLYMYXIN-HC 3.5-10000-1 OT SOLN: 4.0000 [drp] | Freq: Three times a day (TID) | OTIC | 0 refills | Status: DC | PRN

## 2023-12-25 ENCOUNTER — Other Ambulatory Visit: Payer: Self-pay | Admitting: *Deleted

## 2023-12-25 ENCOUNTER — Ambulatory Visit: Payer: Self-pay | Admitting: Family Medicine

## 2023-12-25 DIAGNOSIS — E7800 Pure hypercholesterolemia, unspecified: Secondary | ICD-10-CM

## 2023-12-25 LAB — CBC WITH DIFFERENTIAL/PLATELET
Basophils Absolute: 0 x10E3/uL (ref 0.0–0.2)
Basos: 1 %
EOS (ABSOLUTE): 0 x10E3/uL (ref 0.0–0.4)
Eos: 1 %
Hematocrit: 41.4 % (ref 34.0–46.6)
Hemoglobin: 14.1 g/dL (ref 11.1–15.9)
Immature Grans (Abs): 0 x10E3/uL (ref 0.0–0.1)
Immature Granulocytes: 0 %
Lymphocytes Absolute: 1.4 x10E3/uL (ref 0.7–3.1)
Lymphs: 33 %
MCH: 34 pg — ABNORMAL HIGH (ref 26.6–33.0)
MCHC: 34.1 g/dL (ref 31.5–35.7)
MCV: 100 fL — ABNORMAL HIGH (ref 79–97)
Monocytes Absolute: 0.5 x10E3/uL (ref 0.1–0.9)
Monocytes: 12 %
Neutrophils Absolute: 2.3 x10E3/uL (ref 1.4–7.0)
Neutrophils: 53 %
Platelets: 223 x10E3/uL (ref 150–450)
RBC: 4.15 x10E6/uL (ref 3.77–5.28)
RDW: 12.8 % (ref 11.7–15.4)
WBC: 4.3 x10E3/uL (ref 3.4–10.8)

## 2023-12-25 LAB — COMPREHENSIVE METABOLIC PANEL WITH GFR
ALT: 17 IU/L (ref 0–32)
AST: 21 IU/L (ref 0–40)
Albumin: 4.8 g/dL (ref 3.9–4.9)
Alkaline Phosphatase: 62 IU/L (ref 44–121)
BUN/Creatinine Ratio: 10 — AB (ref 12–28)
BUN: 7 mg/dL — AB (ref 8–27)
Bilirubin Total: 0.6 mg/dL (ref 0.0–1.2)
CO2: 19 mmol/L — AB (ref 20–29)
Calcium: 10 mg/dL (ref 8.7–10.3)
Chloride: 93 mmol/L — AB (ref 96–106)
Creatinine, Ser: 0.69 mg/dL (ref 0.57–1.00)
Globulin, Total: 2.1 g/dL (ref 1.5–4.5)
Glucose: 87 mg/dL (ref 70–99)
Potassium: 4.4 mmol/L (ref 3.5–5.2)
Sodium: 130 mmol/L — AB (ref 134–144)
Total Protein: 6.9 g/dL (ref 6.0–8.5)
eGFR: 97 mL/min/1.73 (ref >59)

## 2023-12-25 LAB — LIPID PANEL
Cholesterol, Total: 266 mg/dL — AB (ref 100–199)
HDL: 101 mg/dL (ref >39)
LDL CALC COMMENT:: 2.6 ratio (ref 0.0–4.4)
LDL Chol Calc (NIH): 157 mg/dL — AB (ref 0–99)
Triglycerides: 53 mg/dL (ref 0–149)
VLDL Cholesterol Cal: 8 mg/dL (ref 5–40)

## 2023-12-25 LAB — HIV ANTIBODY (ROUTINE TESTING W REFLEX): HIV Screen 4th Generation wRfx: NONREACTIVE

## 2023-12-30 LAB — CYTOLOGY - PAP
Comment: NEGATIVE
Diagnosis: NEGATIVE
High risk HPV: NEGATIVE

## 2024-01-14 ENCOUNTER — Ambulatory Visit (HOSPITAL_BASED_OUTPATIENT_CLINIC_OR_DEPARTMENT_OTHER)
Admission: RE | Admit: 2024-01-14 | Discharge: 2024-01-14 | Disposition: A | Discharge status: Home / Self Care | Payer: Self-pay | Source: Ambulatory Visit | Attending: Family Medicine | Admitting: Family Medicine

## 2024-01-14 DIAGNOSIS — E7800 Pure hypercholesterolemia, unspecified: Secondary | ICD-10-CM | POA: Insufficient documentation

## 2024-01-19 ENCOUNTER — Ambulatory Visit: Payer: Self-pay | Admitting: Family Medicine

## 2024-02-13 ENCOUNTER — Encounter: Payer: Self-pay | Admitting: Family Medicine

## 2024-02-24 ENCOUNTER — Encounter: Payer: Self-pay | Admitting: *Deleted

## 2024-02-24 ENCOUNTER — Encounter: Payer: Self-pay | Admitting: Family Medicine

## 2024-03-09 ENCOUNTER — Telehealth: Payer: Self-pay | Admitting: *Deleted

## 2024-03-09 NOTE — Telephone Encounter (Signed)
 Copied from CRM [id number]. Topic: Clinical - Medication Question >> Mar 09, 2024 11:15 AM Debby BROCKS wrote: Reason for CRM: Patient would like to receive the covid prescription so that she may receive the vaccine at her pharmacy. Unknown which they have in stock   Called pharmacy and she can walk in, no rx needed-patient notified.

## 2024-03-17 ENCOUNTER — Ambulatory Visit: Admitting: Family Medicine

## 2024-03-17 VITALS — BP 134/80 | HR 60 | Ht 68.0 in | Wt 157.0 lb

## 2024-03-17 DIAGNOSIS — M25561 Pain in right knee: Secondary | ICD-10-CM

## 2024-03-17 DIAGNOSIS — R1031 Right lower quadrant pain: Secondary | ICD-10-CM

## 2024-03-17 DIAGNOSIS — M79671 Pain in right foot: Secondary | ICD-10-CM | POA: Diagnosis not present

## 2024-03-17 MED ORDER — MELOXICAM 15 MG PO TABS: 15.0000 mg | ORAL_TABLET | Freq: Every day | ORAL | 0 refills | Status: DC

## 2024-03-17 NOTE — Progress Notes (Signed)
 Chief Complaint  Patient presents with   Leg Pain    Started 2 weeks ago right shin area, thought it was shin splints. Now has knee pain, groin pain and right foot pain and numbness. Tried advil , massage, TENS unit and nothing has helped. Waking up at 2:15 am, has to sleep on her back.    Started 2 weeks ago with pain in the R shin. She thought it could have been shin splints, though she denies any change to her routine--same walking time/distance and shoes. Discomfort has persisted. Now she is also having pain at the right medial knee, discomfort at R lateral ankle (partly numb/tingly) and foot, and pain at her right groin/adductors.  She has used massage gun, TENS unit, stretching, methocarbamol  (about 10d ago, didn't help), ibuprofen  (400 mg three times daily for at least a week). The right groin pain is less, but otherwise not much improvement. Pain awakens her at night, even with taking ibuprofen  before bed. Feels like a dull toothache at knee and ankle. Sharper pain at the groin.  She has been trying to rest her leg. She has tried walking slowly on the treadmill, but this causes increased achey pain, as with activity.     PMH, PSH, SH reviewed  Outpatient Encounter Medications as of 03/17/2024  Medication Sig Note   azelastine (ASTELIN) 0.1 % nasal spray Place 2 sprays into both nostrils 2 (two) times daily. 03/17/2024: As needed   azelastine (OPTIVAR) 0.05 % ophthalmic solution Place 1 drop into both eyes daily. 03/17/2024: As needed   b complex vitamins tablet Take 1 tablet by mouth daily.    Berberine Chloride (BERBERINE HCI PO) Take 250 mg by mouth daily.    CALCIUM CITRATE PO Take 1 tablet by mouth 2 (two) times daily.    cetirizine (ZYRTEC) 10 MG tablet Take 10 mg by mouth daily.    Cholecalciferol (VITAMIN D ) 1000 UNITS capsule Take 1,000 Units by mouth daily.    famotidine (PEPCID) 20 MG tablet Take 20 mg by mouth daily.    ibuprofen  (ADVIL ) 200 MG tablet Take 400 mg by  mouth every 6 (six) hours as needed. 03/17/2024: Took 2 this am   methocarbamol  (ROBAXIN ) 500 MG tablet Take 1-2 tablets (500-1,000 mg total) by mouth every 6 (six) hours as needed for muscle spasms. 03/17/2024: Tried about 10 days ago, no help   Multiple Vitamins-Minerals (MULTIVITAMIN WITH MINERALS) tablet Take 1 tablet by mouth daily.    neomycin -polymyxin-hydrocortisone (CORTISPORIN) OTIC solution Place 4 drops into the right ear 3 (three) times daily as needed (ear itching). 03/17/2024: Has on hand for ear itching   Omega-3 Fatty Acids (FISH OIL) 1000 MG CAPS Take 1 capsule by mouth daily.    vitamin C (ASCORBIC ACID) 500 MG tablet Take 500 mg by mouth daily.    No facility-administered encounter medications on file as of 03/17/2024.   Allergies  Allergen Reactions   Lanolin Rash   Latex Rash    ROS: No f/c, URI or allergy symptoms. No CP, SOB. No rash, bleeding, bruising. Denies GI issues--no abdominal pain, bowel changes. RLE pain per HPI. Denies back pain.     PHYSICAL EXAM:  BP 134/80   Pulse 60   Ht 5' 8 (1.727 m)   Wt 157 lb (71.2 kg)   LMP 11/25/1997   BMI 23.87 kg/m   Wt Readings from Last 3 Encounters:  03/17/24 157 lb (71.2 kg)  12/24/23 159 lb (72.1 kg)  06/09/23 158 lb (71.7 kg)  Pleasant female, in no distres HEENT: conjunctiva and sclera are clear, EOMI. Neck: no lymphadenopathy or mass  RLE: Knee--no effusion, warmth, joint line tenderness. Negative lachman, McMurray.  Valgus stress caused discomfort (sharp pain) at R medial groin Nontender at malleoli, surrounding tissue, ligaments, and bones in the foot, without any soft tissue swelling or erythema.  The area of discomfort was her lateral proximal foot. No pain with any ankle ROM, dorsi or plantar flexion, eversion or inversion against resistance. Normal strength, sensation. Hip--Some discomfort with abduction on right, but has FROM.  She also had discomfort with crossing right ankle to L knee  (hip flexion/abduction)--caused pain at right groin, none in the buttock/posteriorly. Doing these ROM and strength testing caused more discomfort below her R knee, into the shin.   ASSESSMENT/PLAN:  Right groin pain - Ddx includes muscle strain, tendonitis, hip OA.  Check XR of R hip.  ice/heat, stretches and NSAID - Plan: meloxicam  (MOBIC ) 15 MG tablet, DG HIP UNILAT W OR W/O PELVIS 2-3 VIEWS RIGHT  Acute pain of right knee - exam doesn't suggest underlying pathology.  Trial of NSAID. Poss related to change in gait due ot shin/hip pain. Consider PT if not improving vs sports med eval - Plan: meloxicam  (MOBIC ) 15 MG tablet  Right foot pain - suspect poss strain or tendonitis.  No mechanism to suggest fracture, nontender on exam. Trial NSAID. f/u with Dr. Vita if pain in RLE persists - Plan: meloxicam  (MOBIC ) 15 MG tablet   Take meloxicam  once daily with food.  (Advised to wait 4 hours from last dose of ibuprofen .  STOP taking any ibuprofen , and don't use similar medications (advil /motrin , aleve/naproxen, Goody/BC powder). You may use tylenol products along with the meloxicam . You may use the methocarbamol  for any muscle spasm. Ice after activity, otherwise use heat. Gentle stretches.  Go to [facility] for x-ray of your right hip. Follow up with Dr. Vita in 1-2 weeks if not improving with the meloxicam .   I spent 30 minutes dedicated to the care of this patient, including pre-visit review of records, face to face time, post-visit ordering of testing and documentation.

## 2024-03-17 NOTE — Patient Instructions (Addendum)
 Take meloxicam  once daily with food. STOP taking any ibuprofen , and don't use similar medications (advil /motrin , aleve/naproxen, Goody/BC powder). You may use tylenol products along with the meloxicam . You may use the methocarbamol  for any muscle spasm. Ice after activity, otherwise use heat. Gentle stretches.  Go to [facility] for x-ray of your right hip. Follow up with Dr. Vita in 1-2 weeks if not improving with the meloxicam .

## 2024-03-19 ENCOUNTER — Ambulatory Visit
Admission: RE | Admit: 2024-03-19 | Discharge: 2024-03-19 | Disposition: A | Discharge status: Home / Self Care | Source: Ambulatory Visit | Attending: Family Medicine | Admitting: Family Medicine

## 2024-03-19 DIAGNOSIS — R1031 Right lower quadrant pain: Secondary | ICD-10-CM

## 2024-03-24 ENCOUNTER — Ambulatory Visit: Payer: Self-pay | Admitting: Family Medicine

## 2024-03-29 ENCOUNTER — Ambulatory Visit: Admitting: Family Medicine

## 2024-03-29 VITALS — BP 130/78 | HR 93 | Wt 158.2 lb

## 2024-03-29 DIAGNOSIS — M5441 Lumbago with sciatica, right side: Secondary | ICD-10-CM | POA: Diagnosis not present

## 2024-03-29 DIAGNOSIS — R202 Paresthesia of skin: Secondary | ICD-10-CM

## 2024-03-29 DIAGNOSIS — R200 Anesthesia of skin: Secondary | ICD-10-CM

## 2024-03-29 DIAGNOSIS — M79604 Pain in right leg: Secondary | ICD-10-CM

## 2024-03-29 MED ORDER — PREDNISONE 20 MG PO TABS: 40.0000 mg | ORAL_TABLET | Freq: Every day | ORAL | 0 refills | Status: AC

## 2024-03-29 NOTE — Progress Notes (Signed)
 Name: Shannon [name]   Date of Visit: 03/29/24   Date of last visit with me: Visit date not found   CHIEF COMPLAINT:  Chief Complaint  Patient presents with   Acute Visit    Pain in lower legs and feet       HPI:  Discussed the use of AI scribe software for clinical note transcription with the patient, who gave verbal consent to proceed.  History of Present Illness   Shannon [name] is a 63 year old female with a history of herniated discs who presents with right leg pain and numbness.  She has been experiencing pain in her right shin for approximately three to four weeks, which has progressed to her knee and groin area. The pain is described as similar to 'shin splints' from her track and field days, is persistent throughout the day, and often wakes her from sleep.  She has been taking meloxicam , which has provided some relief, but the pain persists. She has also tried Advil , massages, and TENS units without significant improvement. Numbness and tingling extend from her knee down to her ankle and foot, primarily on the right side, and are more pronounced than the pain at present.  Her activity level has decreased due to the pain, impacting her ability to walk on the treadmill, perform yard work, and play golf. She used to walk on the treadmill five days a week for about half an hour each day.  She has a history of back issues, including a broken tailbone at age 78 and herniated discs in her lower back around 2012-2014. She has experienced sciatica in the past but does not currently feel sciatica-like pain.         OBJECTIVE:       12/24/2023    8:30 AM  Depression screen PHQ 2/9  Decreased Interest 0  Down, Depressed, Hopeless 0  PHQ - 2 Score 0     BP Readings from Last 3 Encounters:  03/29/24 130/78  03/17/24 134/80  12/24/23 126/76    BP 130/78   Pulse 93   Wt 158 lb 3.2 oz (71.8 kg)   LMP 11/25/1997   SpO2 98%   BMI 24.05 kg/m    Physical Exam    MUSCULOSKELETAL: Limited range of motion in the right hip due to bone spur.      Physical Exam   Straight leg test negative.  FADIR FABER negative.  Range of motion is decreased with internal rotation of the right hip.  Normal external rotation.  Left side has no restrictions. 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 Herniated discs and sciatica present. Hip x-rays show minimal arthritis, not correlating with pain. Differential diagnosis favors nerve compression over hip arthritis. - Prescribed 7-day course of steroids to reduce inflammation and assess if pain is nerve-related. - Ordered x-rays of the back. - Scheduled follow-up in 4 weeks to assess treatment response and consider MRI if symptoms persist. - Instructed on [employer] exercises for sciatica at home. - Discontinued meloxicam  while on steroids. - Allowed use of Tylenol for other pain, not expected to help with nerve pain.  Right hip osteoarthritis, minimal X-rays show minimal osteoarthritic changes with a bone spur, not correlating with pain pattern. Symptoms align more  with nerve-related pain from the back. - Monitor hip symptoms, no specific treatment required.         Shannon [name] A. Vita MD [facility]

## 2024-03-30 ENCOUNTER — Ambulatory Visit
Admission: RE | Admit: 2024-03-30 | Discharge: 2024-03-30 | Disposition: A | Discharge status: Home / Self Care | Source: Ambulatory Visit | Attending: Family Medicine | Admitting: Family Medicine

## 2024-03-30 DIAGNOSIS — R200 Anesthesia of skin: Secondary | ICD-10-CM

## 2024-03-30 DIAGNOSIS — M79604 Pain in right leg: Secondary | ICD-10-CM

## 2024-03-31 ENCOUNTER — Ambulatory Visit: Payer: Self-pay | Admitting: Family Medicine

## 2024-04-11 ENCOUNTER — Encounter: Payer: Self-pay | Admitting: Family Medicine

## 2024-04-11 DIAGNOSIS — M5441 Lumbago with sciatica, right side: Secondary | ICD-10-CM

## 2024-04-12 ENCOUNTER — Other Ambulatory Visit: Payer: Self-pay | Admitting: Internal Medicine

## 2024-04-12 ENCOUNTER — Telehealth: Payer: Self-pay

## 2024-04-12 DIAGNOSIS — M5441 Lumbago with sciatica, right side: Secondary | ICD-10-CM

## 2024-04-12 MED ORDER — PREDNISONE 10 MG (21) PO TBPK: ORAL_TABLET | Freq: Every day | ORAL | 0 refills | Status: DC

## 2024-04-12 MED ORDER — PREDNISONE 10 MG (21) PO TBPK
ORAL_TABLET | Freq: Every day | ORAL | 0 refills | Status: DC
Start: 2024-04-12 — End: 2024-04-12

## 2024-04-12 NOTE — Telephone Encounter (Signed)
 Resent to [facility]

## 2024-04-12 NOTE — Addendum Note (Signed)
 Addended by: VICCI HUSBAND A on: 04/12/2024 09:56 AM   Modules accepted: Orders

## 2024-04-12 NOTE — Telephone Encounter (Signed)
 Copied from CRM [id number]. Topic: Clinical - Medication Question >> Apr 12, 2024  9:19 AM Myrick T wrote: Reason for CRM: patient called stated that [facility] does not have predniSONE  (STERAPRED UNI-PAK 21 TAB) 10 MG (21) TBPK tablet in stock and would like to have medication transferred to [id number] [place], [place] - [address]  Phone: [phone] Fax: [phone]

## 2024-04-26 ENCOUNTER — Ambulatory Visit: Payer: Self-pay | Admitting: Family Medicine

## 2024-04-28 ENCOUNTER — Ambulatory Visit (INDEPENDENT_AMBULATORY_CARE_PROVIDER_SITE_OTHER): Admitting: Family Medicine

## 2024-04-28 ENCOUNTER — Encounter: Payer: Self-pay | Admitting: Family Medicine

## 2024-04-28 VITALS — BP 116/74 | HR 64 | Wt 160.6 lb

## 2024-04-28 DIAGNOSIS — M25561 Pain in right knee: Secondary | ICD-10-CM

## 2024-04-28 DIAGNOSIS — M5441 Lumbago with sciatica, right side: Secondary | ICD-10-CM | POA: Diagnosis not present

## 2024-04-28 DIAGNOSIS — R252 Cramp and spasm: Secondary | ICD-10-CM

## 2024-04-28 DIAGNOSIS — R1031 Right lower quadrant pain: Secondary | ICD-10-CM | POA: Diagnosis not present

## 2024-04-28 DIAGNOSIS — M79671 Pain in right foot: Secondary | ICD-10-CM

## 2024-04-28 MED ORDER — MELOXICAM 15 MG PO TABS: 15.0000 mg | ORAL_TABLET | Freq: Every day | ORAL | 0 refills | Status: AC

## 2024-04-28 NOTE — Patient Instructions (Addendum)
 Magnesium Glycinate   200mg  nightly     PHASE 1 -- FOUNDATIONAL STABILITY (Daily or 5/week)  These exercises calm the joint, reduce shear forces, and re-train proper pelvic alignment.  1. Posterior Pelvic Tilt  Lie on back, knees bent.  Gently flatten low back into the floor by tilting pelvis backward.  Hold 5 seconds  10 reps.  2. Transverse Abdominis Activation ("TA Brace")  Lying on back, gently pull lower belly inward as if zipping tight pants.  Hold 5-10 sec  10 reps.  Should NOT hold breath.  3. Isometric Glute Squeeze  Pinch glutes together while lying or standing.  Hold 5 sec  20 reps.  4. Modified Dead Bug (Pelvic Control)  Knees bent, arms up.  Slowly tap one heel to the floor while keeping core engaged.  2 sets of 10 per side.  ? PHASE 2 -- GLUTE STRENGTHENING (3-4/week)  Glute weakness is one of the strongest risk factors for SI joint pain.  5. Glute Bridge  Push through heels, lift hips.  Keep pelvis level.  3 sets of 12-15.  6. Clamshells  Lie on side, knees bent.  Open top knee without rotating hips.  3 sets of 12-15 per side. Band optional ? increases SI stability.  7. Side-Lying Leg Raises (Glute Medius)  Leg straight, toes slightly down.  Lift slowly.  3 sets of 12 per side.  8. Hip Hike (Pelvic Stabilizer)  Stand on single step.  Raise/lower opposite hip slowly.  3 sets of 10 per side. Great for pelvic asymmetry.  ? PHASE 3 -- FUNCTIONAL & ADVANCED STABILITY (3/week)  For long-term prevention and return to lifting/sports.  9. Single-Leg Bridge  Same as bridge but one leg lifted.  3 sets of 8-10 per leg.  10. Pallof Press (Anti-Rotation Stability)  Great for SI joint because it reduces torsion at pelvis.  Use cable/band; press forward resisting rotation.  2-3 sets of 10 per side.  11. Bird Dog  Keep pelvis level.  Opposite arm + leg extend slowly.  3 sets of 10 per side.  12. Lateral Band  Walks  Band around ankles or knees.  Step sideways keeping tension.  3 sets of 10-15 steps each direction.  ?? Movements to Avoid Early On  Deep single-leg bending (RDLs, lunges)  Twisting through the pelvis  Prolonged asymmetric loading  Hyperextension (aggressive yoga backbends)  These increase SI shear force.

## 2024-04-28 NOTE — Progress Notes (Signed)
 Name: [name]   Date of Visit: 04/28/24   Date of last visit with me: 03/29/2024   CHIEF COMPLAINT:  Chief Complaint  Patient presents with   Follow-up    Follow up on pain in lower legs and feet. Doing better, still has some cramps below knee. Pain in lower back, feels like pressure.        HPI:  Discussed the use of AI scribe software for clinical note transcription with the patient, who gave verbal consent to proceed.  History of Present Illness   Shannon [name] is a 63 year old female with degenerative spinal changes who presents with nocturnal leg cramping and radiating back pain.  She experiences nocturnal cramping in her calves and feet, which occurs unexpectedly and is not relieved by hydration. She drinks electrolytes with almost every glass of water and ensures she consumes at least 60 ounces of fluids daily. Despite these efforts, the cramping persists, primarily at night.  She has had x-rays of her spine performed in the past. She reports pain that sometimes radiates down her leg, more so on the right side. Previous treatments have included a longer course of steroids, which provided some relief. She is not currently taking any medications to manage these symptoms.  She participates in a work benefit program called [employer], focusing on back exercises, including squats and bridges, at least three times a week. She has not been using meloxicam  recently to assess her baseline pain levels but has a supply available for flare-ups. She also has a complete pack of prednisone  available if needed.         OBJECTIVE:       12/24/2023    8:30 AM  Depression screen PHQ 2/9  Decreased Interest 0  Down, Depressed, Hopeless 0  PHQ - 2 Score 0     BP Readings from Last 3 Encounters:  04/28/24 116/74  03/29/24 130/78  03/17/24 134/80    BP 116/74   Pulse 64   Wt 160 lb 9.6 oz (72.8 kg)   LMP 11/25/1997   SpO2 98%   BMI 24.42 kg/m    Physical Exam           Physical Exam Constitutional:      Appearance: Normal appearance.  Neurological:     General: No focal deficit present.     Mental Status: She is alert and oriented to person, place, and time. Mental status is at baseline.     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 X-rays show degenerative changes and osteophyte formation. Previous steroids provided relief. MRI needed for nerve compression assessment. Surgery not recommended. - Ordered MRI to assess for specific nerve compression. - Continue strength training regimen, including exercises provided in AVS. - Consider steroid injection based on MRI findings. - Prescribed meloxicam  for flare-ups, to be taken for 3-4 days as needed. - Provided exercises to strengthen pelvis and lower lumbar region.  Nocturnal muscle cramps of calves and feet Intermittent cramps likely due to muscular weakness and possible magnesium deficiency. - Recommended magnesium glycinate 200 mg nightly, up to 300 mg daily, an hour before bed. - Ensure adequate hydration and electrolyte intake, especially in the evening.  Anush Wiedeman A. Vita MD [facility]

## 2024-04-30 ENCOUNTER — Telehealth: Payer: Self-pay | Admitting: Internal Medicine

## 2024-04-30 NOTE — Telephone Encounter (Signed)
 MRI scheduled for 11/26. Needs Peer to peer since it has been denied.  Copied from CRM [id number]. Topic: Referral - Prior Authorization Question >> Apr 30, 2024  2:15 PM Charlet HERO wrote: Reason for CRM: Shannon [name] [phone] is calling to get asst with auth that was denied and wants to know if would like to do p2p.

## 2024-05-03 NOTE — Telephone Encounter (Signed)
 Rueben calling from [facility] is calling to follow up on the below authorization since it was denied by insurance Please call [phone] to schedule Peer to Peer

## 2024-05-05 ENCOUNTER — Other Ambulatory Visit

## 2024-06-20 ENCOUNTER — Encounter: Payer: Self-pay | Admitting: Family Medicine

## 2024-06-20 DIAGNOSIS — L299 Pruritus, unspecified: Secondary | ICD-10-CM

## 2024-06-21 ENCOUNTER — Other Ambulatory Visit: Payer: Self-pay | Admitting: Family Medicine

## 2024-06-21 DIAGNOSIS — M5441 Lumbago with sciatica, right side: Secondary | ICD-10-CM

## 2024-06-21 MED ORDER — NEOMYCIN-POLYMYXIN-HC 3.5-10000-1 OT SOLN: 4.0000 [drp] | Freq: Three times a day (TID) | OTIC | 0 refills | Status: AC | PRN

## 2024-06-21 MED ORDER — PREDNISONE 20 MG PO TABS: 20.0000 mg | ORAL_TABLET | Freq: Every day | ORAL | 0 refills | Status: AC

## 2025-01-12 ENCOUNTER — Encounter: Payer: Self-pay | Admitting: Family Medicine
